# Patient Record
Sex: Female | Born: 1985 | Race: White | Hispanic: Yes | Marital: Single | Smoking: Never smoker
Health system: Southern US, Community
[De-identification: ages and names within clinical notes are randomized; demographics above are authoritative.]

## PROBLEM LIST (undated history)

## (undated) ENCOUNTER — Emergency Department: Admission: EM | Payer: Self-pay | Source: Home / Self Care

## (undated) DIAGNOSIS — D649 Anemia, unspecified: Secondary | ICD-10-CM

## (undated) DIAGNOSIS — K5900 Constipation, unspecified: Secondary | ICD-10-CM

## (undated) HISTORY — DX: Constipation, unspecified: K59.00

## (undated) HISTORY — DX: Anemia, unspecified: D64.9

## (undated) HISTORY — PX: COLON SURGERY: SHX602

---

## 2009-02-22 ENCOUNTER — Emergency Department: Payer: Self-pay | Admitting: Emergency Medicine

## 2009-06-05 ENCOUNTER — Inpatient Hospital Stay (HOSPITAL_COMMUNITY): Admission: AD | Admit: 2009-06-05 | Discharge: 2009-06-05 | Payer: Self-pay | Admitting: Obstetrics & Gynecology

## 2009-09-28 ENCOUNTER — Inpatient Hospital Stay (HOSPITAL_COMMUNITY): Admission: AD | Admit: 2009-09-28 | Discharge: 2009-10-02 | Payer: Self-pay | Admitting: Obstetrics & Gynecology

## 2009-12-30 ENCOUNTER — Emergency Department (HOSPITAL_COMMUNITY): Admission: EM | Admit: 2009-12-30 | Discharge: 2009-12-30 | Payer: Self-pay | Admitting: Emergency Medicine

## 2010-04-18 LAB — URINALYSIS, ROUTINE W REFLEX MICROSCOPIC
Glucose, UA: NEGATIVE mg/dL
Protein, ur: 30 mg/dL — AB
pH: 5.5 (ref 5.0–8.0)

## 2010-04-18 LAB — DIFFERENTIAL
Basophils Relative: 0 % (ref 0–1)
Lymphocytes Relative: 18 % (ref 12–46)
Lymphs Abs: 2 10*3/uL (ref 0.7–4.0)
Monocytes Absolute: 0.9 10*3/uL (ref 0.1–1.0)
Monocytes Relative: 8 % (ref 3–12)
Neutro Abs: 8.5 10*3/uL — ABNORMAL HIGH (ref 1.7–7.7)
Neutrophils Relative %: 74 % (ref 43–77)

## 2010-04-18 LAB — GC/CHLAMYDIA PROBE AMP, GENITAL
Chlamydia, DNA Probe: NEGATIVE
GC Probe Amp, Genital: NEGATIVE

## 2010-04-18 LAB — URINE MICROSCOPIC-ADD ON

## 2010-04-18 LAB — CBC
HCT: 32 % — ABNORMAL LOW (ref 36.0–46.0)
Hemoglobin: 11.2 g/dL — ABNORMAL LOW (ref 12.0–15.0)
RBC: 3.81 MIL/uL — ABNORMAL LOW (ref 3.87–5.11)
WBC: 11.4 10*3/uL — ABNORMAL HIGH (ref 4.0–10.5)

## 2010-04-18 LAB — POCT I-STAT, CHEM 8
BUN: 13 mg/dL (ref 6–23)
Chloride: 106 mEq/L (ref 96–112)
Creatinine, Ser: 0.8 mg/dL (ref 0.4–1.2)
Glucose, Bld: 102 mg/dL — ABNORMAL HIGH (ref 70–99)
HCT: 33 % — ABNORMAL LOW (ref 36.0–46.0)
Potassium: 3.5 mEq/L (ref 3.5–5.1)

## 2010-04-21 LAB — CBC
HCT: 26.7 % — ABNORMAL LOW (ref 36.0–46.0)
HCT: 37.4 % (ref 36.0–46.0)
Hemoglobin: 12.8 g/dL (ref 12.0–15.0)
MCH: 31.8 pg (ref 26.0–34.0)
MCHC: 34.2 g/dL (ref 30.0–36.0)
MCV: 90.4 fL (ref 78.0–100.0)
MCV: 92.3 fL (ref 78.0–100.0)
RBC: 2.89 MIL/uL — ABNORMAL LOW (ref 3.87–5.11)
RDW: 13.9 % (ref 11.5–15.5)
WBC: 14.3 10*3/uL — ABNORMAL HIGH (ref 4.0–10.5)

## 2010-04-25 LAB — URINALYSIS, ROUTINE W REFLEX MICROSCOPIC
Bilirubin Urine: NEGATIVE
Specific Gravity, Urine: 1.02 (ref 1.005–1.030)
pH: 7 (ref 5.0–8.0)

## 2010-04-25 LAB — URINE MICROSCOPIC-ADD ON

## 2010-04-25 LAB — URINE CULTURE

## 2010-04-25 LAB — WET PREP, GENITAL
Clue Cells Wet Prep HPF POC: NONE SEEN
Trich, Wet Prep: NONE SEEN
Yeast Wet Prep HPF POC: NONE SEEN

## 2011-02-26 ENCOUNTER — Ambulatory Visit: Payer: Self-pay | Admitting: Family Medicine

## 2013-02-04 IMAGING — US US RENAL KIDNEY
1 series · 17 of 22 positions shown · non-contrast
Comparison: none

REASON FOR EXAM: recurrent UTI
COMMENTS:

PROCEDURE:     US  - US KIDNEY  - February 26, 2011  [DATE]
RESULT:

[Series 1: us renal kidney · 17 of 22 slices shown]
[im 1/22]
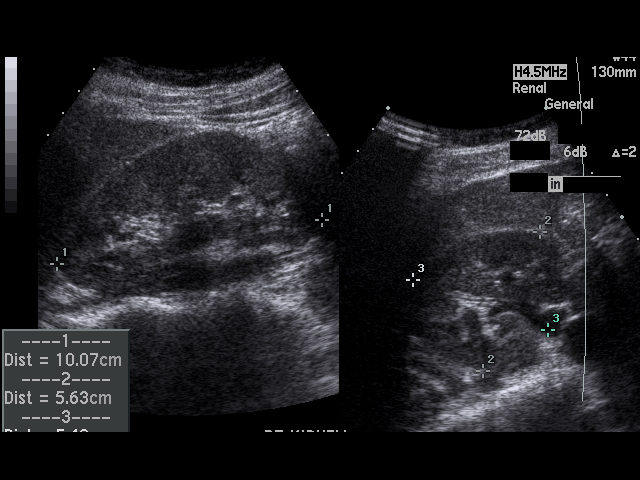
[im 2/22]
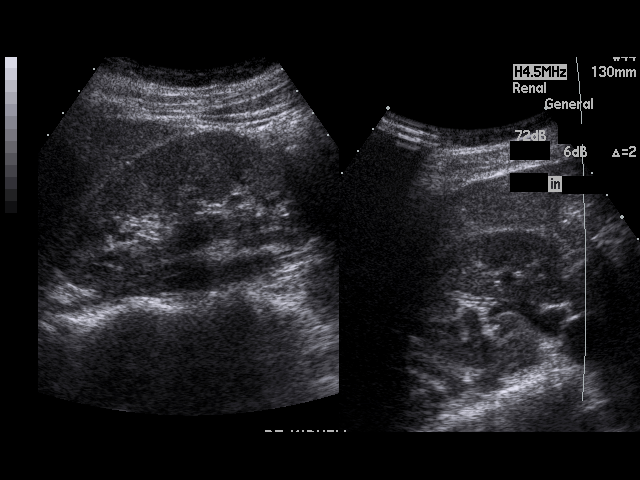
[im 4/22]
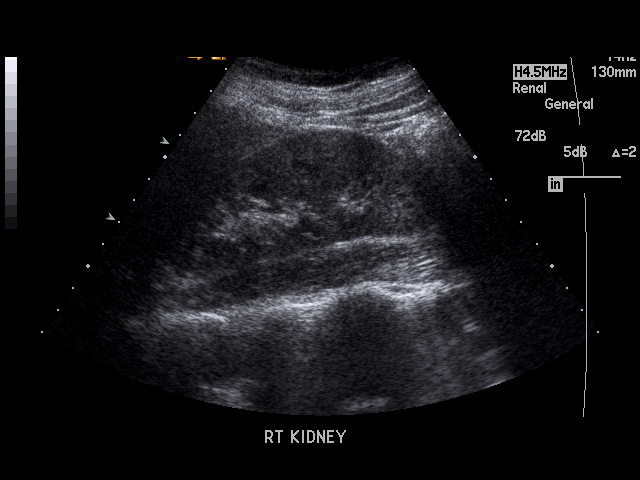
[im 5/22]
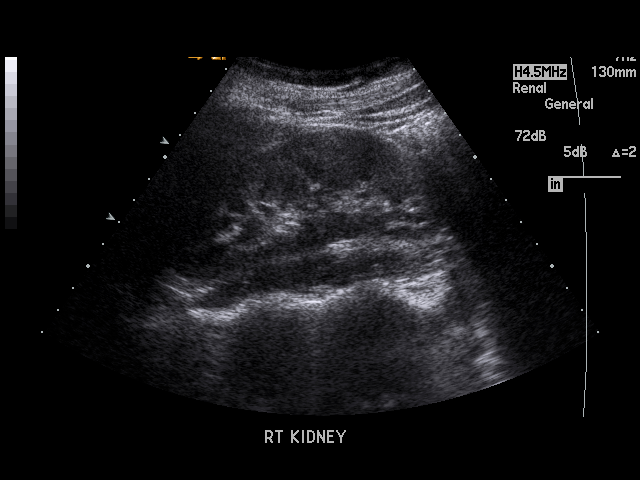
[im 6/22]
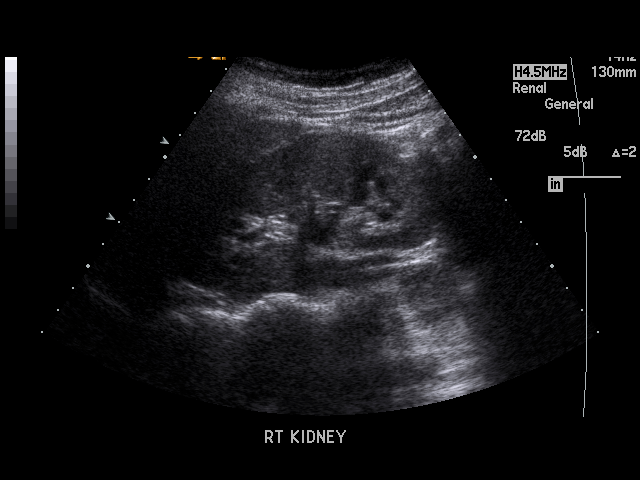
[im 8/22]
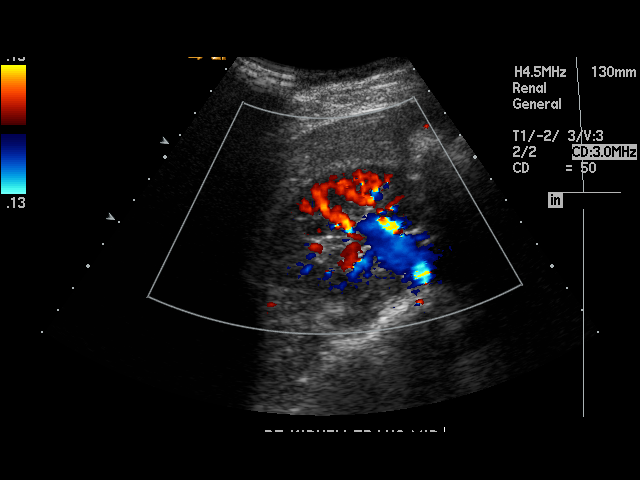
[im 9/22]
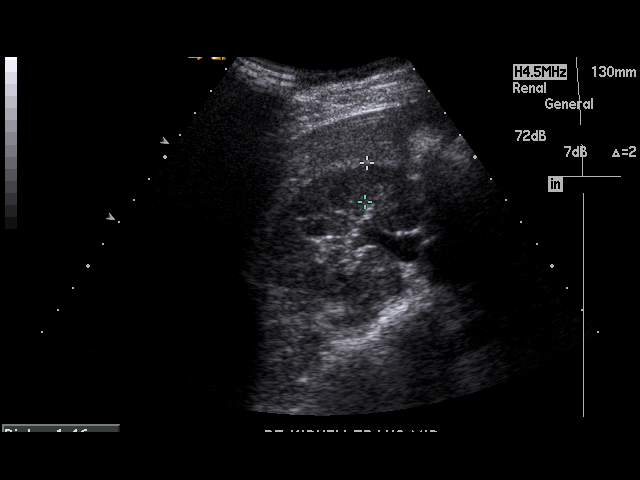
[im 10/22]
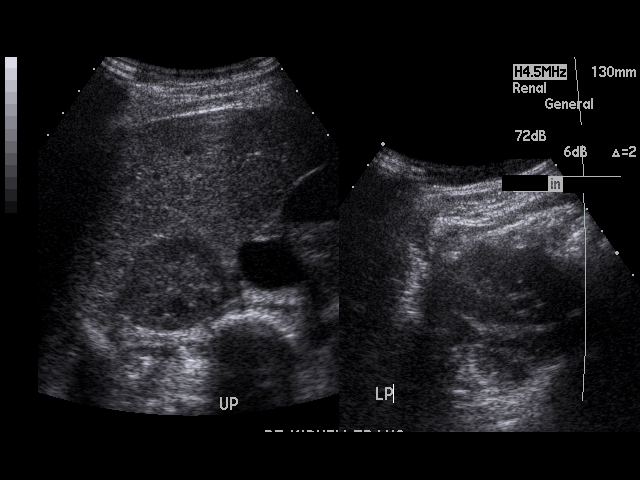
[im 12/22]
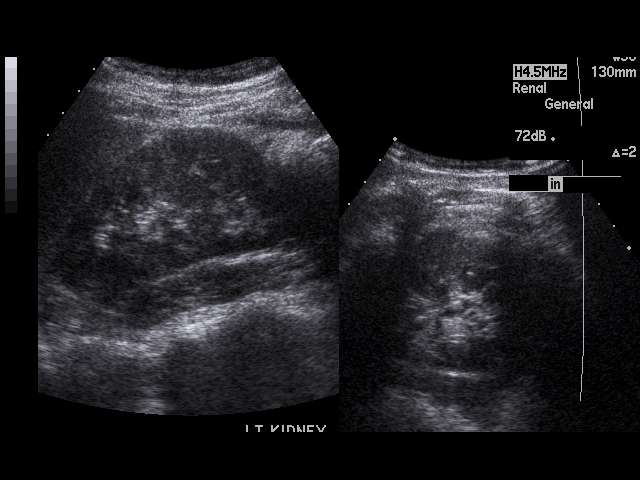
[im 13/22]
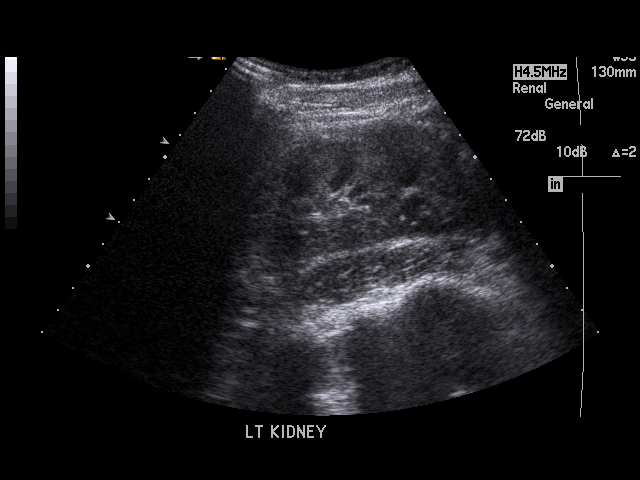
[im 14/22]
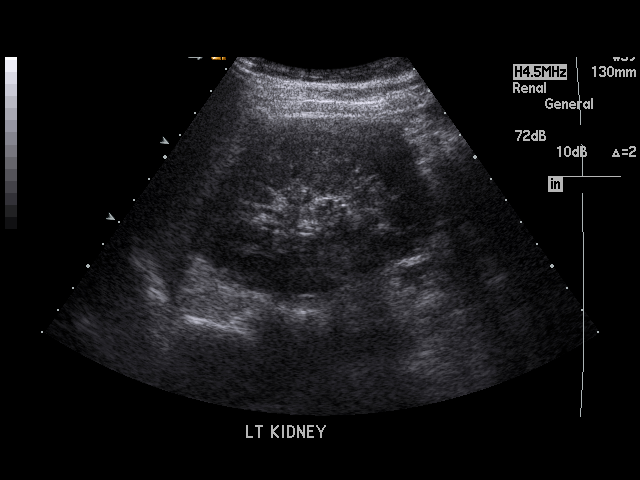
[im 15/22]
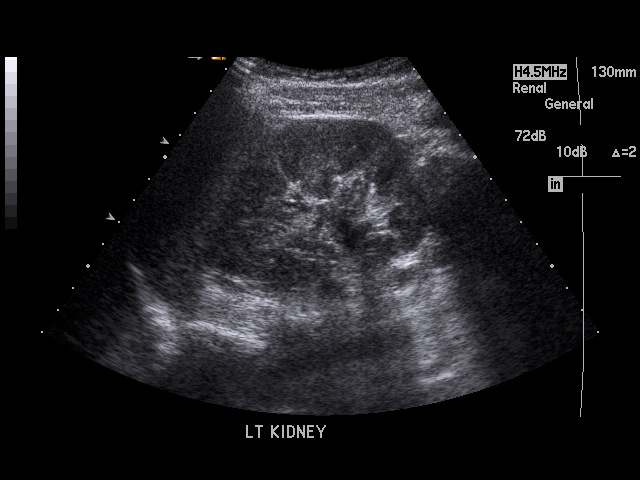
[im 17/22]
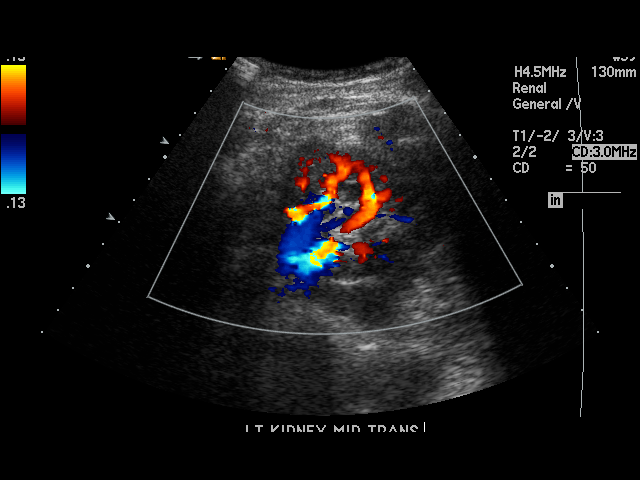
[im 18/22]
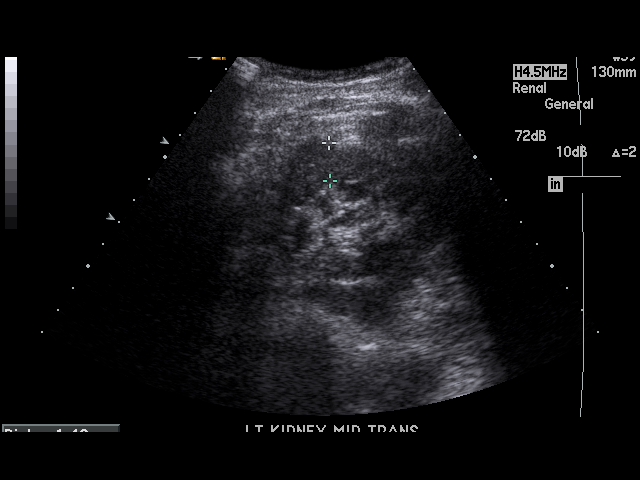
[im 19/22]
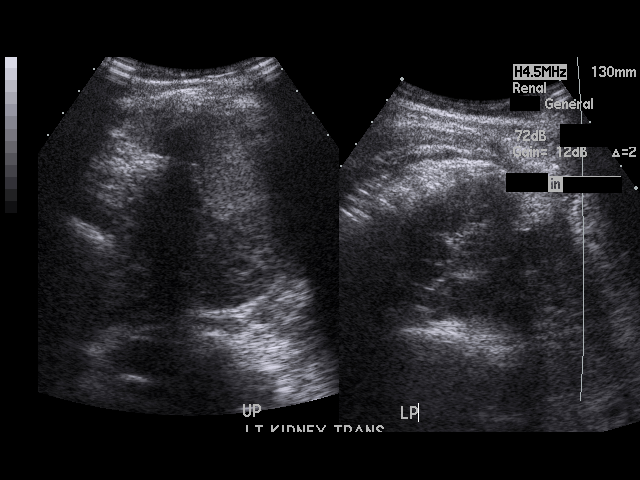
[im 21/22]
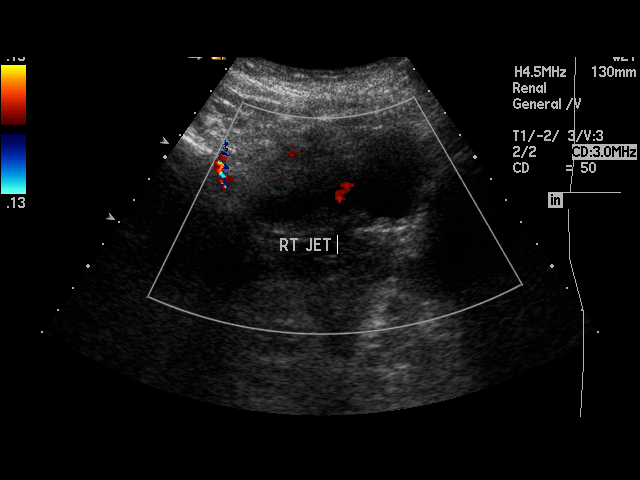
[im 22/22]
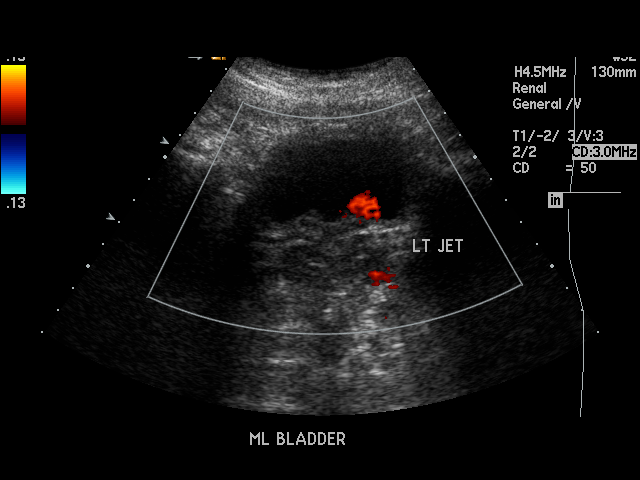

[17 of 22 positions shown; findings below may reference images not displayed]

FINDINGS: The right kidney measures 10.07 x 5.63 x 5.4 cm and on the left
10.28 x 5.79 x 4.58 cm. There is appropriate corticomedullary
differentiation without evidence of hydronephrosis, calculi, or solid or
cystic masses. Bilateral ureteral jets are identified. The urinary bladder
is partially distended. There are findings which raise suspicion of diffuse
bladder wall thickening.
IMPRESSION: Findings which may reflect the sequelae of cystitis, if
clinically appropriate. Otherwise, unremarkable Renal Ultrasound.

## 2013-02-05 LAB — HM PAP SMEAR: HM Pap smear: NEGATIVE

## 2014-09-01 ENCOUNTER — Ambulatory Visit (INDEPENDENT_AMBULATORY_CARE_PROVIDER_SITE_OTHER): Payer: PRIVATE HEALTH INSURANCE | Admitting: Obstetrics and Gynecology

## 2014-09-01 ENCOUNTER — Encounter: Payer: Self-pay | Admitting: Obstetrics and Gynecology

## 2014-09-01 VITALS — BP 100/62 | HR 87 | Ht 63.0 in | Wt 123.8 lb

## 2014-09-01 DIAGNOSIS — Z36 Encounter for antenatal screening of mother: Secondary | ICD-10-CM | POA: Diagnosis not present

## 2014-09-01 DIAGNOSIS — Z3201 Encounter for pregnancy test, result positive: Secondary | ICD-10-CM | POA: Diagnosis not present

## 2014-09-01 DIAGNOSIS — Z369 Encounter for antenatal screening, unspecified: Secondary | ICD-10-CM

## 2014-09-02 LAB — URINALYSIS, ROUTINE W REFLEX MICROSCOPIC
Bilirubin, UA: NEGATIVE
GLUCOSE, UA: NEGATIVE
LEUKOCYTES UA: NEGATIVE
Nitrite, UA: NEGATIVE
Protein, UA: NEGATIVE
RBC UA: NEGATIVE
Specific Gravity, UA: 1.017 (ref 1.005–1.030)
Urobilinogen, Ur: 0.2 mg/dL (ref 0.2–1.0)
pH, UA: 6 (ref 5.0–7.5)

## 2014-09-02 LAB — CBC WITH DIFFERENTIAL/PLATELET
BASOS ABS: 0 10*3/uL (ref 0.0–0.2)
Basos: 0 %
EOS (ABSOLUTE): 0 10*3/uL (ref 0.0–0.4)
EOS: 0 %
Hematocrit: 36.7 % (ref 34.0–46.6)
Hemoglobin: 12.3 g/dL (ref 11.1–15.9)
IMMATURE GRANULOCYTES: 0 %
Immature Grans (Abs): 0 10*3/uL (ref 0.0–0.1)
LYMPHS ABS: 2.3 10*3/uL (ref 0.7–3.1)
Lymphs: 23 %
MCH: 29 pg (ref 26.6–33.0)
MCHC: 33.5 g/dL (ref 31.5–35.7)
MCV: 87 fL (ref 79–97)
MONOCYTES: 5 %
MONOS ABS: 0.5 10*3/uL (ref 0.1–0.9)
Neutrophils Absolute: 7.4 10*3/uL — ABNORMAL HIGH (ref 1.4–7.0)
Neutrophils: 72 %
Platelets: 232 10*3/uL (ref 150–379)
RBC: 4.24 x10E6/uL (ref 3.77–5.28)
RDW: 13.6 % (ref 12.3–15.4)
WBC: 10.2 10*3/uL (ref 3.4–10.8)

## 2014-09-02 LAB — GC/CHLAMYDIA PROBE AMP
Chlamydia trachomatis, NAA: NEGATIVE
Neisseria gonorrhoeae by PCR: NEGATIVE

## 2014-09-02 LAB — ABO AND RH: RH TYPE: POSITIVE

## 2014-09-02 LAB — HEPATITIS B SURFACE ANTIGEN: Hepatitis B Surface Ag: NEGATIVE

## 2014-09-02 LAB — HIV ANTIBODY (ROUTINE TESTING W REFLEX): HIV Screen 4th Generation wRfx: NONREACTIVE

## 2014-09-02 LAB — VARICELLA ZOSTER ANTIBODY, IGG: VARICELLA: 1384 {index} (ref >165)

## 2014-09-02 LAB — RPR: RPR: NONREACTIVE

## 2014-09-02 LAB — RUBELLA ANTIBODY, IGM

## 2014-09-02 NOTE — Progress Notes (Signed)
Subjective:    Paulene Tayag is a 29 y.o. G39P1001 female who presents for evaluation of amenorrhea. She believes she could be pregnant. Pregnancy is desired. Sexual Activity: single partner, contraception: none. Current symptoms also include: morning sickness and positive home pregnancy test. Last period was normal.  Patient's last menstrual period was 06/25/2014.    The following portions of the patient's history were reviewed and updated as appropriate: allergies, current medications, past family history, past medical history, past social history, past surgical history and problem list.  Review of Systems Pertinent items are noted in HPI.     Objective:    BP 100/62 mmHg  Pulse 87  Ht  (1.6 m)  Wt 123 lb 12.8 oz (56.155 kg)  BMI 21.94 kg/m2  LMP 06/25/2014 General: alert, no distress and no acute distress    Lab Review Urine HCG: positive    Assessment:    Absence of menstruation.     Plan:    Pregnancy Test: Positive: EDC: 03/31/14, EGA 9.5 weeks. Briefly discussed pre-natal care options. Encouraged well-balanced diet, plenty of rest when needed, pre-natal vitamins daily and walking for exercise. Discussed self-help for nausea, avoiding OTC medications until consulting provider or pharmacist, other than Tylenol as needed, minimal caffeine (1-2 cups daily) and avoiding alcohol. She will schedule her initial OB visit in the next month with her PCP or OB provider. Feel free to call with any questions. Will order pelvic sono to confirm gestational age.  Also desires genetic testing.  Can perform at the same time.    RTC in 2 weeks for NOB visit.  Patient also reports upcoming trip to [place].  Discussed travel precautions, Zika virus prevention tips given.  Will monitor once patient returns from trip.  Advised on using condoms for 6-8 weeks after patients return for prevention of sexual transmission.    Hildred Laser, MD [facility]

## 2014-09-03 LAB — URINE CULTURE: ORGANISM ID, BACTERIA: NO GROWTH

## 2014-09-14 ENCOUNTER — Ambulatory Visit: Payer: PRIVATE HEALTH INSURANCE

## 2014-09-14 ENCOUNTER — Ambulatory Visit (INDEPENDENT_AMBULATORY_CARE_PROVIDER_SITE_OTHER): Payer: PRIVATE HEALTH INSURANCE | Admitting: Obstetrics and Gynecology

## 2014-09-14 ENCOUNTER — Other Ambulatory Visit: Payer: Self-pay | Admitting: Obstetrics and Gynecology

## 2014-09-14 ENCOUNTER — Encounter: Payer: Self-pay | Admitting: Obstetrics and Gynecology

## 2014-09-14 VITALS — BP 101/57 | HR 80 | Wt 125.1 lb

## 2014-09-14 DIAGNOSIS — O34219 Maternal care for unspecified type scar from previous cesarean delivery: Secondary | ICD-10-CM

## 2014-09-14 DIAGNOSIS — Z3492 Encounter for supervision of normal pregnancy, unspecified, second trimester: Secondary | ICD-10-CM | POA: Diagnosis not present

## 2014-09-14 DIAGNOSIS — N898 Other specified noninflammatory disorders of vagina: Secondary | ICD-10-CM

## 2014-09-14 DIAGNOSIS — Z369 Encounter for antenatal screening, unspecified: Secondary | ICD-10-CM

## 2014-09-14 DIAGNOSIS — Z3201 Encounter for pregnancy test, result positive: Secondary | ICD-10-CM

## 2014-09-14 DIAGNOSIS — O3421 Maternal care for scar from previous cesarean delivery: Secondary | ICD-10-CM

## 2014-09-14 DIAGNOSIS — Z36 Encounter for antenatal screening of mother: Secondary | ICD-10-CM | POA: Diagnosis not present

## 2014-09-14 LAB — POCT URINALYSIS DIPSTICK
Bilirubin, UA: NEGATIVE
Blood, UA: NEGATIVE
GLUCOSE UA: NEGATIVE
KETONES UA: NEGATIVE
Leukocytes, UA: NEGATIVE
NITRITE UA: NEGATIVE
Protein, UA: NEGATIVE
Spec Grav, UA: <=1.005
Urobilinogen, UA: 0.2
pH, UA: 8.5

## 2014-09-14 NOTE — Progress Notes (Signed)
Subjective:  Interpreter used for today's visit.    Laura Woods is being seen today for her first obstetrical visit.  This is not a planned pregnancy. She is at [redacted]w[redacted]d gestation. Estimated Date of Delivery: 03/16/15 (by today's sono, inconsistent with dates).  Her obstetrical history is significant for none. Relationship with FOB: spouse, living together. Patient does intend to breast feed. Pregnancy history fully reviewed.  Menstrual History: Obstetric History   G2   P1   T1   P0   A0   TAB0   SAB0   E0   M0   L1     # Outcome Date GA Lbr Len/2nd Weight Sex Delivery Anes PTL Lv  2 Current           1 Term 09/29/09 [redacted]w[redacted]d  7 lb (3.175 kg) M CS-LTranv   Y      Menarche age: 49  Patient's last menstrual period was 06/25/2014. Denies h/o abnormal pap smears or STIs.  Last pap smear was 2015, normal, per pt (performed by PCP at [facility]).    History reviewed. No pertinent past medical history.  Past Surgical History  Procedure Laterality Date  . Colon surgery     Family History  Problem Relation Age of Onset  . Healthy Mother   . Healthy Father     History   Social History  . Marital Status: Single    Spouse Name: N/A  . Number of Children: N/A  . Years of Education: N/A   Occupational History  . Not on file.   Social History Main Topics  . Smoking status: Never Smoker   . Smokeless tobacco: Never Used  . Alcohol Use: No  . Drug Use: No  . Sexual Activity: Yes    Birth Control/ Protection: Condom     Comment: Pregnant   Other Topics Concern  . Not on file   Social History Narrative    Outpatient Encounter Prescriptions as of 09/14/2014  Medication Sig  . Prenatal Vit-Fe Fumarate-FA (MULTIVITAMIN-PRENATAL) 27-0.8 MG TABS tablet Take 1 tablet by mouth daily at 12 noon.    No Known Allergies  Review of Systems General:Not Present- Fever, Weight Loss and Weight Gain. Skin:Not Present- Rash. HEENT:Not Present- Blurred Vision, Headache and  Bleeding Gums. Respiratory:Not Present- Difficulty Breathing. Breast:Not Present- Breast Mass. Cardiovascular:Not Present- Chest Pain, Elevated Blood Pressure, Fainting / Blacking Out and Shortness of Breath. Gastrointestinal:Not Present- Abdominal Pain, Constipation, Nausea and Vomiting. Female Genitourinary:Present - Vaginal Discharge (white, thin, no odor, mild itching 2 days ago). Not Present- Frequency, Painful Urination, Pelvic Pain, Vaginal Bleeding, Contractions, regular, Fetal Movements Decreased, Urinary Complaints and Vaginal Fluid. Musculoskeletal:Not Present- Back Pain and Leg Cramps. Neurological:Not Present- Dizziness. Psychiatric:Not Present- Depression.    Objective:   Blood pressure 101/57, pulse 80, weight 125 lb 1.6 oz (56.745 kg), last menstrual period 06/25/2014.   General Appearance:    Alert, cooperative, no distress, appears stated age  Head:    Normocephalic, without obvious abnormality, atraumatic  Eyes:    PERRL, conjunctiva/corneas clear, EOM's intact, both eyes  Ears:    Normal external ear canals, both ears  Nose:   Nares normal, septum midline, mucosa normal, no drainage or sinus tenderness  Throat:   Lips, mucosa, and tongue normal; teeth and gums normal  Neck:   Supple, symmetrical, trachea midline, no adenopathy; thyroid: no enlargement/tenderness/nodules; no       carotid bruit or JVD  Back:     Symmetric, no curvature, ROM normal,  no CVA tenderness  Lungs:     Clear to auscultation bilaterally, respirations unlabored  Chest Wall:    No tenderness or deformity   Heart:    Regular rate and rhythm, S1 and S2 normal, no murmur, rub or gallop  Breast Exam:    No tenderness, masses, or nipple abnormality  Abdomen:     Soft, non-tender, bowel sounds active all four quadrants, no masses, no organomegaly.  FH 14.  FHT 152 bpm. Well healed Pfannenstiel incision.   Genitalia:    Pelvic:external genitalia normal, vagina without lesions, or tenderness,  rectovaginal septum normal.  Vagina with small amount of thin white discharge, no odor.  Cervix normal in appearance, no cervical motion tenderness, no adnexal masses or tenderness. Pregnancy positive findings: uterine enlargement: 14 wk size, nontender.   Rectal:    Normal external sphincter.  No hemorrhoids appreciated. Internal exam not done.   Extremities:   Extremities normal, atraumatic, no cyanosis or edema  Pulses:   2+ and symmetric all extremities  Skin:   Skin color, texture, turgor normal, no rashes or lesions  Lymph nodes:   Cervical, supraclavicular, and axillary nodes normal  Neurologic:   CNII-XII intact, normal strength, sensation and reflexes throughout      Ultrasound OB 09/14/2014:  Findings:  Singleton intrauterine pregnancy is visualized with a CRL consistent with 13 6/[redacted] weeks gestation, giving an (U/S) EDD of 03-16-2015. The (U/S) EDD isNOT consistent with the clinically established (LMP) EDD of 04-01-15.  FHR: 162 bpm CRL measurement: 78.9 mm NT measurement: 3.1 mm. (Just above normal = 3 mm) Early anatomy appears normal.  Survey of the adnexa demonstrates no adnexal masses.  There is no free peritoneal fluid in the cul de sac.  Assessment:    Pregnancy at 13 and 6/7 weeks (by today's sono, inconsistent with dates)  H/o prior C-section x 1 NT measurement thickened on ultrasound H/o vaginal discharge  Plan:   1. Pregnancy at [redacted]w[redacted]d - Initial labs reviewed, normal. - Prenatal vitamins. - Problem list reviewed and updated. - AFP3 discussed: performed today. NT measurement on ultrasound is borderline elevated.  NT on ultrasound borderline elevated (3.1 mm, cut off 3.0 mm).  Discussed findings with patient, including opting for a different test [employer] screen, Panorama) or continuing with 1st trimester screen as she is at the cutoff gestational age.  Patient desires to continue with current testing.  - New OB counseling:  The patient has been given an overview  regarding routine prenatal care.  Recommendations regarding diet, weight gain, and exercise in pregnancy were given. Prenatal testing, optional genetic testing, and ultrasound use in pregnancy were reviewed.  - Benefits of Breast Feeding were discussed. The patient is encouraged to consider nursing her baby post partum. 2.  H/o prior C-section x 1. - H/o prior C-section x 1 - performed in [place] (failure to progress?).  Brief discussion had on TOLAC vs repeat C-section.  Patient undecided currently, but leaning more toward TOLAC. To continue discussion at next visit.  3. Vaginal discharge - no odor or vaginal irritation noted. Nuswab sent.  Follow up in 4 weeks.   Hildred Laser, MD [facility]

## 2014-09-14 NOTE — Progress Notes (Signed)
NOB PE today. Pt c/o White d/c - itchy at times. ? Yeast infection. nt scan today.  Last pap- 10/2013- wnl.

## 2014-09-16 LAB — FIRST TRIMESTER SCREEN W/NT
CRL: 78.9 mm
DIA MOM: 0.89
DIA VALUE: 216.1 pg/mL
Gest Age-Collect: 13.3 weeks
MATERNAL AGE AT EDD: 29.6 a
NUCHAL TRANSLUCENCY MOM: 1.68
Nuchal Translucency: 3.1 mm
Number of Fetuses: 1
PAPP-A MOM: 0.67
PAPP-A Value: 1104.5 ng/mL
Test Results:: NEGATIVE
Weight: 125 [lb_av]
hCG MoM: 0.91
hCG Value: 82.8 IU/mL

## 2014-09-18 LAB — NUSWAB BV AND CANDIDA, NAA: Atopobium vaginae: HIGH Score — AB

## 2014-10-12 ENCOUNTER — Encounter: Payer: Self-pay | Admitting: Obstetrics and Gynecology

## 2014-10-12 ENCOUNTER — Ambulatory Visit (INDEPENDENT_AMBULATORY_CARE_PROVIDER_SITE_OTHER): Payer: PRIVATE HEALTH INSURANCE | Admitting: Obstetrics and Gynecology

## 2014-10-12 VITALS — BP 96/55 | HR 97 | Wt 132.4 lb

## 2014-10-12 DIAGNOSIS — Z3402 Encounter for supervision of normal first pregnancy, second trimester: Secondary | ICD-10-CM

## 2014-10-12 LAB — POCT URINALYSIS DIPSTICK
BILIRUBIN UA: NEGATIVE
Glucose, UA: NEGATIVE
Ketones, UA: NEGATIVE
LEUKOCYTES UA: NEGATIVE
Nitrite, UA: NEGATIVE
PH UA: 6.5
PROTEIN UA: NEGATIVE
RBC UA: NEGATIVE
Spec Grav, UA: 1.015
UROBILINOGEN UA: NEGATIVE

## 2014-10-12 MED ORDER — PRENATE DHA 18-0.6-0.4-300 MG PO CAPS: ORAL_CAPSULE | ORAL | Status: DC

## 2014-10-12 NOTE — Progress Notes (Signed)
ROB: Patient denies complaints. 1st trimester screen neg.  Further discussion had regarding TOLAC vs VBAC (h/o prior C-section x 43for fetal malpositioning, arrest of 2nd stage of labor at 39 weeks).  Patient still unsure, given handout.  To discuss next visit.  For serum AFP today.  RTC in 4 weeks for next visit and anatomy scan.  May be planning to travel to [place] after next visit.  Zika Virus transmission and prevention strategies discussed, handout given.

## 2014-10-18 LAB — AFP, SERUM, OPEN SPINA BIFIDA
AFP MoM: 0.8
AFP Value: 35.8 ng/mL
GEST. AGE ON COLLECTION DATE: 17.9 wk
Maternal Age At EDD: 29.6 years
OSBR Risk 1 IN: 10000
Test Results:: NEGATIVE
Weight: 132 [lb_av]

## 2014-11-10 ENCOUNTER — Ambulatory Visit: Payer: PRIVATE HEALTH INSURANCE

## 2014-11-10 ENCOUNTER — Ambulatory Visit (INDEPENDENT_AMBULATORY_CARE_PROVIDER_SITE_OTHER): Payer: PRIVATE HEALTH INSURANCE | Admitting: Obstetrics and Gynecology

## 2014-11-10 VITALS — BP 111/58 | HR 85 | Temp 98.6°F | Wt 137.0 lb

## 2014-11-10 DIAGNOSIS — Z3402 Encounter for supervision of normal first pregnancy, second trimester: Secondary | ICD-10-CM | POA: Diagnosis not present

## 2014-11-10 DIAGNOSIS — O34219 Maternal care for unspecified type scar from previous cesarean delivery: Secondary | ICD-10-CM | POA: Insufficient documentation

## 2014-11-10 DIAGNOSIS — Z3492 Encounter for supervision of normal pregnancy, unspecified, second trimester: Secondary | ICD-10-CM | POA: Diagnosis not present

## 2014-11-10 DIAGNOSIS — Z23 Encounter for immunization: Secondary | ICD-10-CM | POA: Diagnosis not present

## 2014-11-10 LAB — POCT URINALYSIS DIP (MANUAL ENTRY)
BILIRUBIN UA: NEGATIVE
Bilirubin, UA: NEGATIVE
Blood, UA: NEGATIVE
Glucose, UA: NEGATIVE
Leukocytes, UA: NEGATIVE
Nitrite, UA: NEGATIVE
PH UA: 7
PROTEIN UA: NEGATIVE
Urobilinogen, UA: 0.2

## 2014-11-10 NOTE — Progress Notes (Signed)
ROB: Patient denies complaints.  S/p normal anatomy scan today.  Discussed traveling precautions, patient unsure when she may be leaving for [place]. Also disscused TOLAC vs. Repeat c-section again.  Still unsure.  Also discussed BTL, including risks, benefits.  Uncertain at this time.  To continue discusions at subsequent visits. Handouts given.  Flu vaccine given today. RTC in 4 weeks.

## 2014-12-09 ENCOUNTER — Encounter: Payer: PRIVATE HEALTH INSURANCE | Admitting: Obstetrics and Gynecology

## 2014-12-14 ENCOUNTER — Ambulatory Visit (INDEPENDENT_AMBULATORY_CARE_PROVIDER_SITE_OTHER): Payer: PRIVATE HEALTH INSURANCE | Admitting: Obstetrics and Gynecology

## 2014-12-14 ENCOUNTER — Encounter: Payer: Self-pay | Admitting: Obstetrics and Gynecology

## 2014-12-14 VITALS — BP 109/69 | HR 96 | Wt 145.7 lb

## 2014-12-14 DIAGNOSIS — Z3492 Encounter for supervision of normal pregnancy, unspecified, second trimester: Secondary | ICD-10-CM

## 2014-12-14 LAB — POCT URINALYSIS DIPSTICK
Bilirubin, UA: NEGATIVE
GLUCOSE UA: 500
Ketones, UA: NEGATIVE
Nitrite, UA: NEGATIVE
Protein, UA: NEGATIVE
SPEC GRAV UA: 1.025
UROBILINOGEN UA: NEGATIVE
pH, UA: 6

## 2014-12-14 NOTE — Patient Instructions (Signed)
Ligadura de trompas posparto  (Postpartum Tubal Ligation)  La ligadura de trompas posparto es un procedimiento que se realiza para cerrar las trompas de Falopio inmediatamente después del parto, o uno o dos días después. La ligadura de trompas posparto se realiza antes de que el útero recupere su posición normal. El procedimiento también se denomina minilaparotomía. Cuando las trompas de Falopio se cierran, los óvulos que liberan los ovarios no pueden ingresar al útero, y los espermatozoides no pueden llegar a los óvulos. La ligadura de trompas posparto se realiza para evitar los embarazos.  Si bien este procedimiento puede revertirse, debe considerarse permanente e irreversible. Si usted desea quedar embarazada en el futuro, no debe realizarse este procedimiento.  INFORME A SU MÉDICO:  · Cualquier alergia que tenga.  · Todos los medicamentos que utiliza, incluidos vitaminas, hierbas, gotas oftálmicas, cremas y medicamentos de venta libre. Esto incluye cualquier clase de corticoide, ya sea por vía oral o en crema.  · Problemas previos que usted o los miembros de su familia hayan tenido con el uso de anestésicos.  · Enfermedades de la sangre que tenga.  · Si tiene cirugías previas.  · Si tiene alguna enfermedad.  RIESGOS Y COMPLICACIONES  · Infección.  · Hemorragia.  · Lesión en los órganos circundantes.  · Efectos secundarios de los anestésicos.  · Fallas en el procedimiento.  · Embarazo ectópico.  · Arrepentimiento posterior por haberse realizado el procedimiento.  ANTES DEL PROCEDIMIENTO  · Quizás deba firmar ciertos documentos, incluido un formulario de consentimiento informado, hasta 30 días antes de la fecha de la ligadura de trompas.  · Siga las indicaciones del médico respecto de las restricciones para las comidas y las bebidas.  PROCEDIMIENTO   Si se realiza 1 o 2 días después del parto vaginal:  · Podrán administrarle uno o más de los siguientes medicamentos:    Un medicamento para ayudarla a relajarse  (sedante).    Un medicamento que adormece el área (anestesia local).    Un medicamento que la hará dormir (anestesia general).    Un medicamento que se inyecta en una zona del cuerpo para adormecer toda la región que se encuentra por debajo del lugar de la inyección (anestesia regional).  · Si le administran anestesia general, le introducirán un tubo en la garganta para ayudarla a respirar.  · Es posible que le vacíen la vejiga con una pequeña sonda (catéter).  · Le harán un pequeño corte (incisión) justo por encima de la línea del vello púbico.  · A través de la incisión, las trompas se ubicarán y se subirán.  · Luego, se atarán o quemarán (cauterizarán), o se cerrarán con un clip, un anillo o una abrazadera. En muchos casos, también se eliminará una pequeña parte en el centro de cada trompa de Falopio.  · La incisión se cerrará con puntos (suturas).  · Se colocará un vendaje (apósito) sobre la incisión.  Este procedimiento puede variar según el médico y el hospital.  Si se realiza después de una cesárea:  · La ligadura de trompas se hará a través de la misma incisión del parto por cesárea.  · Después de obstruir las trompas, la incisión se cerrará con puntos (suturas).  · Se colocará un vendaje (apósito) sobre la incisión.  Este procedimiento puede variar según el médico y el hospital.  DESPUÉS DEL PROCEDIMIENTO  · Le controlarán con frecuencia la presión arterial, la frecuencia cardíaca, la frecuencia respiratoria y el nivel de oxígeno en la sangre hasta que haya desaparecido   el efecto de los medicamentos administrados.  · Le darán medicamentos para el dolor si los necesita.  · Si le administraron anestesia general, puede tener algunas molestias leves en la garganta. Esto se debe al tubo que le colocaron en la garganta para poder respirar mientras dormía.  · Puede sentirse cansada, y es conveniente que haga reposo durante el resto del día.  · Puede sentir algo de dolor o calambres en el área abdominal durante 3 a  7 días.     Esta información no tiene como fin reemplazar el consejo del médico. Asegúrese de hacerle al médico cualquier pregunta que tenga.     Document Released: 11/01/2004 Document Revised: 06/08/2014  Elsevier Interactive Patient Education ©2016 Elsevier Inc.

## 2014-12-14 NOTE — Progress Notes (Signed)
ROB: Notes cramping in legs, mostly at night.  Also notes mild cramping in abdomen. Notes adequate hydration. Advised on increasing potassium/calcium (Tums, bananas, milk).  [facility]  Desires B[facility] postpartum.  Will have patient sign papers next visit.  For Tdap and blood consent next visit. Glucosuria noted in urine.  Will have patient complete 1 hr glucola this week.

## 2014-12-15 ENCOUNTER — Telehealth: Payer: Self-pay | Admitting: Obstetrics and Gynecology

## 2014-12-15 NOTE — Telephone Encounter (Signed)
Pt 27 wks and is spotting brownish fluid., she wants to know if this is normal

## 2014-12-17 ENCOUNTER — Other Ambulatory Visit: Payer: Self-pay | Admitting: Obstetrics and Gynecology

## 2014-12-17 ENCOUNTER — Other Ambulatory Visit: Payer: PRIVATE HEALTH INSURANCE

## 2014-12-17 ENCOUNTER — Other Ambulatory Visit: Payer: Self-pay

## 2014-12-17 DIAGNOSIS — Z3490 Encounter for supervision of normal pregnancy, unspecified, unspecified trimester: Secondary | ICD-10-CM

## 2014-12-17 DIAGNOSIS — Z131 Encounter for screening for diabetes mellitus: Secondary | ICD-10-CM

## 2014-12-17 NOTE — Telephone Encounter (Signed)
Pt states that yesterday she had some brownish colored discharge, pt states that she had to wear a panty liner. Pt denies any bright red bleeding or cramping, or leakage of fluid. Advised pt to continue to monitor but there was no need for alarm at this time.

## 2014-12-18 LAB — HEMOGLOBIN AND HEMATOCRIT, BLOOD
Hematocrit: 31.7 % — ABNORMAL LOW (ref 34.0–46.6)
Hemoglobin: 10.6 g/dL — ABNORMAL LOW (ref 11.1–15.9)

## 2014-12-18 LAB — GLUCOSE, 1 HOUR GESTATIONAL: Gestational Diabetes Screen: 118 mg/dL (ref 65–139)

## 2014-12-28 ENCOUNTER — Ambulatory Visit (INDEPENDENT_AMBULATORY_CARE_PROVIDER_SITE_OTHER): Payer: PRIVATE HEALTH INSURANCE | Admitting: Obstetrics and Gynecology

## 2014-12-28 VITALS — BP 105/69 | HR 102 | Wt 146.0 lb

## 2014-12-28 DIAGNOSIS — Z23 Encounter for immunization: Secondary | ICD-10-CM

## 2014-12-28 DIAGNOSIS — Z3493 Encounter for supervision of normal pregnancy, unspecified, third trimester: Secondary | ICD-10-CM | POA: Diagnosis not present

## 2014-12-28 LAB — POCT URINALYSIS DIPSTICK
BILIRUBIN UA: NEGATIVE
Blood, UA: NEGATIVE
GLUCOSE UA: NEGATIVE
KETONES UA: NEGATIVE
LEUKOCYTES UA: NEGATIVE
Nitrite, UA: NEGATIVE
Protein, UA: NEGATIVE
SPEC GRAV UA: 1.02
Urobilinogen, UA: NEGATIVE
pH, UA: 6.5

## 2014-12-28 NOTE — Progress Notes (Signed)
ROB: Doing well, notes very active fetal movement. For Tdap today, signed blood consent.  Now debating between BTL and IUD.  Given information on IUDs (received info on BTL last visit). 1 hr glucola normal. RTC in 2 weeks.

## 2014-12-28 NOTE — Patient Instructions (Signed)
Informacin sobre el dispositivo intrauterino (Intrauterine Device Information) Un dispositivo intrauterino (DIU) se inserta en el tero e impide el embarazo. Hay dos tipos de DIU:   DIU de cobre: este tipo de DIU est recubierto con un alambre de cobre y se inserta dentro del tero. El cobre hace que el tero y las trompas de Falopio produzcan un liquido que destruye los espermatozoides. El DIU de cobre puede permanecer en el lugar durante 10 aos.  DIU con hormona: este tipo de DIU contiene la hormona progestina (progesterona sinttica). Las hormonas hacen que el moco cervical se haga ms espeso, lo que evita que el esperma ingrese al tero. Tambin hace que la membrana que recubre internamente al tero sea ms delgada lo que impide el implante del vulo fertilizado. La hormona debilita o destruye los espermatozoides que ingresan al tero. Alguno de los tipos de DIU hormonal pueden permanecer en el lugar durante 5 aos y otros tipos pueden dejarse en el lugar por 3 aos. El mdico se asegurar de que usted sea una buena candidata para usar el DIU. Converse con su mdico acerca de los posibles efectos secundarios.  VENTAJAS DEL DISPOSITIVO INTRAUTERINO  El DIU es muy eficaz, reversible, de accin prolongada y de bajo mantenimiento.  No hay efectos secundarios relacionados con el estrgeno.  El DIU puede ser utilizado durante la lactancia.  No est asociado con el aumento de peso.  Funciona inmediatamente despus de la insercin.  El DIU hormonal funciona inmediatamente si se inserta dentro de los 7 das del inicio del perodo. Ser necesario que utilice un mtodo anticonceptivo adicional durante 7 das si el DIU hormonal se inserta en algn otro momento del ciclo.  El DIU de cobre no interfiere con las hormonas femeninas.  El DIU hormonal puede hacer que los perodos menstruales abundantes se hagan ms ligeros y que haya menos clicos.  El DIU hormonal puede usarse durante 3 a 5  aos.  El DIU de cobre puede usarse durante 10 aos. DESVENTAJAS DEL DISPOSITIVO INTRAUTERINO  El DIU hormonal puede estar asociado con patrones de sangrado irregular.  El DIU de cobre puede hacer que el flujo menstrual ms abundante y doloroso.  Puede experimentar clicos y sangrado vaginal despus de la insercin.   Esta informacin no tiene como fin reemplazar el consejo del mdico. Asegrese de hacerle al mdico cualquier pregunta que tenga.   Document Released: 07/12/2009 Document Revised: 09/24/2012 Elsevier Interactive Patient Education 2016 Elsevier Inc.  

## 2015-01-13 ENCOUNTER — Ambulatory Visit (INDEPENDENT_AMBULATORY_CARE_PROVIDER_SITE_OTHER): Payer: PRIVATE HEALTH INSURANCE | Admitting: Obstetrics and Gynecology

## 2015-01-13 VITALS — BP 118/64 | HR 97 | Wt 147.4 lb

## 2015-01-13 DIAGNOSIS — O34219 Maternal care for unspecified type scar from previous cesarean delivery: Secondary | ICD-10-CM

## 2015-01-13 DIAGNOSIS — Z3483 Encounter for supervision of other normal pregnancy, third trimester: Secondary | ICD-10-CM

## 2015-01-13 DIAGNOSIS — Z3493 Encounter for supervision of normal pregnancy, unspecified, third trimester: Secondary | ICD-10-CM

## 2015-01-13 LAB — POCT URINALYSIS DIPSTICK
Bilirubin, UA: NEGATIVE
Glucose, UA: NEGATIVE
Ketones, UA: NEGATIVE
Leukocytes, UA: NEGATIVE
NITRITE UA: NEGATIVE
PH UA: 6.5
PROTEIN UA: NEGATIVE
RBC UA: NEGATIVE
SPEC GRAV UA: 1.015
UROBILINOGEN UA: NEGATIVE

## 2015-01-13 NOTE — Progress Notes (Signed)
ROB: Notes complaints of increased vaginal discharge with mucus, no odor, irritation, or itching.  Also notes pain in left upper abodmen, burning sensation in lower  Abdomen.  Patient offered reassurance.  Discussed BTL procedure in detail.  Patient desires, consent signed. RTC in 2 week

## 2015-01-13 NOTE — Patient Instructions (Signed)
Ligadura de trompas posparto  (Postpartum Tubal Ligation)  La ligadura de trompas posparto es un procedimiento que se realiza para cerrar las trompas de Falopio inmediatamente después del parto, o uno o dos días después. La ligadura de trompas posparto se realiza antes de que el útero recupere su posición normal. El procedimiento también se denomina minilaparotomía. Cuando las trompas de Falopio se cierran, los óvulos que liberan los ovarios no pueden ingresar al útero, y los espermatozoides no pueden llegar a los óvulos. La ligadura de trompas posparto se realiza para evitar los embarazos.  Si bien este procedimiento puede revertirse, debe considerarse permanente e irreversible. Si usted desea quedar embarazada en el futuro, no debe realizarse este procedimiento.  INFORME A SU MÉDICO:  · Cualquier alergia que tenga.  · Todos los medicamentos que utiliza, incluidos vitaminas, hierbas, gotas oftálmicas, cremas y medicamentos de venta libre. Esto incluye cualquier clase de corticoide, ya sea por vía oral o en crema.  · Problemas previos que usted o los miembros de su familia hayan tenido con el uso de anestésicos.  · Enfermedades de la sangre que tenga.  · Si tiene cirugías previas.  · Si tiene alguna enfermedad.  RIESGOS Y COMPLICACIONES  · Infección.  · Hemorragia.  · Lesión en los órganos circundantes.  · Efectos secundarios de los anestésicos.  · Fallas en el procedimiento.  · Embarazo ectópico.  · Arrepentimiento posterior por haberse realizado el procedimiento.  ANTES DEL PROCEDIMIENTO  · Quizás deba firmar ciertos documentos, incluido un formulario de consentimiento informado, hasta 30 días antes de la fecha de la ligadura de trompas.  · Siga las indicaciones del médico respecto de las restricciones para las comidas y las bebidas.  PROCEDIMIENTO   Si se realiza 1 o 2 días después del parto vaginal:  · Podrán administrarle uno o más de los siguientes medicamentos:    Un medicamento para ayudarla a relajarse  (sedante).    Un medicamento que adormece el área (anestesia local).    Un medicamento que la hará dormir (anestesia general).    Un medicamento que se inyecta en una zona del cuerpo para adormecer toda la región que se encuentra por debajo del lugar de la inyección (anestesia regional).  · Si le administran anestesia general, le introducirán un tubo en la garganta para ayudarla a respirar.  · Es posible que le vacíen la vejiga con una pequeña sonda (catéter).  · Le harán un pequeño corte (incisión) justo por encima de la línea del vello púbico.  · A través de la incisión, las trompas se ubicarán y se subirán.  · Luego, se atarán o quemarán (cauterizarán), o se cerrarán con un clip, un anillo o una abrazadera. En muchos casos, también se eliminará una pequeña parte en el centro de cada trompa de Falopio.  · La incisión se cerrará con puntos (suturas).  · Se colocará un vendaje (apósito) sobre la incisión.  Este procedimiento puede variar según el médico y el hospital.  Si se realiza después de una cesárea:  · La ligadura de trompas se hará a través de la misma incisión del parto por cesárea.  · Después de obstruir las trompas, la incisión se cerrará con puntos (suturas).  · Se colocará un vendaje (apósito) sobre la incisión.  Este procedimiento puede variar según el médico y el hospital.  DESPUÉS DEL PROCEDIMIENTO  · Le controlarán con frecuencia la presión arterial, la frecuencia cardíaca, la frecuencia respiratoria y el nivel de oxígeno en la sangre hasta que haya desaparecido   el efecto de los medicamentos administrados.  · Le darán medicamentos para el dolor si los necesita.  · Si le administraron anestesia general, puede tener algunas molestias leves en la garganta. Esto se debe al tubo que le colocaron en la garganta para poder respirar mientras dormía.  · Puede sentirse cansada, y es conveniente que haga reposo durante el resto del día.  · Puede sentir algo de dolor o calambres en el área abdominal durante 3 a  7 días.     Esta información no tiene como fin reemplazar el consejo del médico. Asegúrese de hacerle al médico cualquier pregunta que tenga.     Document Released: 11/01/2004 Document Revised: 06/08/2014  Elsevier Interactive Patient Education ©2016 Elsevier Inc.

## 2015-01-27 ENCOUNTER — Ambulatory Visit (INDEPENDENT_AMBULATORY_CARE_PROVIDER_SITE_OTHER): Payer: PRIVATE HEALTH INSURANCE | Admitting: Obstetrics and Gynecology

## 2015-01-27 ENCOUNTER — Encounter: Payer: Self-pay | Admitting: Obstetrics and Gynecology

## 2015-01-27 VITALS — BP 101/66 | HR 91 | Wt 150.5 lb

## 2015-01-27 DIAGNOSIS — Z3493 Encounter for supervision of normal pregnancy, unspecified, third trimester: Secondary | ICD-10-CM

## 2015-01-27 LAB — POCT URINALYSIS DIPSTICK
BILIRUBIN UA: NEGATIVE
Glucose, UA: NEGATIVE
KETONES UA: NEGATIVE
Leukocytes, UA: NEGATIVE
Nitrite, UA: NEGATIVE
PH UA: 6.5
PROTEIN UA: NEGATIVE
RBC UA: NEGATIVE
SPEC GRAV UA: 1.01
Urobilinogen, UA: 0.2

## 2015-01-27 NOTE — Progress Notes (Signed)
ROB-c/o slight pressure

## 2015-01-27 NOTE — Progress Notes (Signed)
ROB- doing well, discussed [facility].  

## 2015-01-27 NOTE — Patient Instructions (Signed)
Contracciones de Braxton Hicks °(Braxton Hicks Contractions) °Durante el embarazo, pueden presentarse contracciones uterinas que no siempre indican que está en trabajo de parto.  °¿QUÉ SON LAS CONTRACCIONES DE BRAXTON HICKS?  °Las contracciones que se presentan antes del trabajo de parto se conocen como contracciones de Braxton Hicks o falso trabajo de parto. Hacia el final del embarazo (32 a 34 semanas), estas contracciones pueden aparecen con más frecuencia y volverse más intensas. No corresponden al trabajo de parto verdadero porque estas contracciones no producen el agrandamiento (la dilatación) y el afinamiento del cuello del útero. Algunas veces, es difícil distinguirlas del trabajo de parto verdadero porque en algunos casos pueden ser muy intensas, y las personas tienen diferentes niveles de tolerancia al dolor. No debe sentirse avergonzada si concurre al hospital con falso trabajo de parto. En ocasiones, la única forma de saber si el trabajo de parto es verdadero es que el médico determine si hay cambios en el cuello del útero. °Si no hay problemas prenatales u otras complicaciones de salud asociadas con el embarazo, no habrá inconvenientes si la envían a su casa con falso trabajo de parto y espera que comience el verdadero. °CÓMO DIFERENCIAR EL TRABAJO DE PARTO FALSO DEL VERDADERO °Falso trabajo de parto °· Las contracciones del falso trabajo de parto duran menos y no son tan intensas como las verdaderas. °· Generalmente son irregulares. °· A menudo, se sienten en la parte delantera de la parte baja del abdomen y en la ingle, °· y pueden desaparecer cuando camina o cambia de posición mientras está acostada. °· Las contracciones se vuelven más débiles y su duración es menor a medida que el tiempo transcurre. °· Por lo general, no se hacen progresivamente más intensas, regulares y cercanas entre sí como en el caso del trabajo de parto verdadero. °Verdadero trabajo de parto °· Las contracciones del verdadero  trabajo de parto duran de 30 a 70 segundos, son muy regulares y suelen volverse más intensas, y aumenta su frecuencia. °· No desaparecen cuando camina. °· La molestia generalmente se siente en la parte superior del útero y se extiende hacia la zona inferior del abdomen y hacia la cintura. °· El médico podrá examinarla para determinar si el trabajo de parto es verdadero. El examen mostrará si el cuello del útero se está dilatando y afinando. °LO QUE DEBE RECORDAR °· Continúe haciendo los ejercicios habituales y siga otras indicaciones que el médico le dé. °· Tome todos los medicamentos como le indicó el médico. °· Concurra a las visitas prenatales regulares. °· Coma y beba con moderación si cree que está en trabajo de parto. °· Si las contracciones de Braxton Hicks le provocan incomodidad: °¨ Cambie de posición: si está acostada o descansando, camine; si está caminando, descanse. °¨ Siéntese y descanse en una bañera con agua tibia. °¨ Beba 2 o 3 vasos de agua. La deshidratación puede provocar contracciones. °¨ Respire lenta y profundamente varias veces por hora. °¿CUÁNDO DEBO BUSCAR ASISTENCIA MÉDICA INMEDIATA? °Solicite atención médica de inmediato si: °· Las contracciones se intensifican, se hacen más regulares y cercanas entre sí. °· Tiene una pérdida de líquido por la vagina. °· Tiene fiebre. °· Elimina mucosidad manchada con sangre. °· Tiene una hemorragia vaginal abundante. °· Tiene dolor abdominal permanente. °· Tiene un dolor en la zona lumbar que nunca tuvo antes. °· Siente que la cabeza del bebé empuja hacia abajo y ejerce presión en la zona pélvica. °· El bebé no se mueve tanto como solía. °  °Esta información no tiene como fin   reemplazar el consejo del médico. Asegúrese de hacerle al médico cualquier pregunta que tenga. °  °Document Released: 11/01/2004 Document Revised: 01/27/2013 °Elsevier Interactive Patient Education ©2016 Elsevier Inc. ° °

## 2015-02-06 NOTE — L&D Delivery Note (Addendum)
Delivery Summary for [facility]  Labor Events:   Preterm labor:   Rupture date:   Rupture time:   Rupture type: Bulging bag of water  Fluid Color:   Induction:   Augmentation:   Complications:   Cervical ripening:          Delivery:   Episiotomy:   Lacerations:   Repair suture:   Repair # of packets:   Blood loss (ml):    Information for the patient's newborn:  Dmiyah, Liscano [[id number]]    Delivery 03/16/2015 6:15 AM by  Vaginal, Spontaneous Delivery Sex:  female Gestational Age: [redacted]w[redacted]d Delivery Clinician:  Hildred Laser Living?: Yes        APGARS  One minute Five minutes Ten minutes  Skin color: 0   1      Heart rate: 2   2      Grimace: 2   2      Muscle tone: 2   2      Breathing: 2   2      Totals: 8  9      Presentation/position: Vertex     Resuscitation: None  Cord information: 3 vessels   Disposition of cord blood:     Blood gases sent?  Complications:   Placenta: Delivered: 03/16/2015 6:20 AM  Spontaneous  Intact appearance Newborn Measurements: Weight: 7 lb 3.3 oz (3270 g)  Height: 19.88"  Head circumference:    Chest circumference:    Other providers: Delivery Nurse Transition RN Karlton Lemon  Additional  information: Forceps:   Vacuum:   Breech:   Observed anomalies       Delivery Note At 6:15 AM a viable female was delivered via Vaginal, Spontaneous Delivery (Presentation: vertex ; Right Occiput Anterior).  APGAR: 8, 9; weight 7 lb 3.3 oz (3270 g).   Placenta status: Intact, Spontaneous.  Cord: 3 vessels with the following complications: none .  Cord pH: not obtained  Anesthesia: Epidural  Episiotomy:   Lacerations: 2nd degree;Perineal;1st degree;Vaginal Suture Repair: 2.0 vicryl Est. Blood Loss (mL):  300  Mom to postpartum.  Baby to Couplet care / Skin to Skin.  Hildred Laser 03/16/2015, 10:01 AM

## 2015-02-09 ENCOUNTER — Telehealth: Payer: Self-pay | Admitting: Obstetrics and Gynecology

## 2015-02-09 NOTE — Telephone Encounter (Signed)
PT IS 36 WEEKS AND SHE IS HAVING CRAMPING AND PAIN STARTED THIS MORNING, SHE WENT TO WORK AND IS STILL HAVING THE PAIN AND CRAMPING, NO BLEEDING, NO URGENCY TO URINATE, SHE WANTED A CALL BACK TO MAKE SURE IF THIS IS NORMAL OR NOT.

## 2015-02-09 NOTE — Telephone Encounter (Signed)
Pt called and states she has been having cramping today off and off. Pt notes that she has been on her feet all day at work and infrequently gets to rest. Pt denies bleeding, leaking of fluid, or contractions. Advised pt on the need to hydrate adequately and take Tylenol as needed for pain. Advised pt on labor precautions. Will provide note for work at this weeks visit.

## 2015-02-11 ENCOUNTER — Ambulatory Visit (INDEPENDENT_AMBULATORY_CARE_PROVIDER_SITE_OTHER): Payer: PRIVATE HEALTH INSURANCE | Admitting: Obstetrics and Gynecology

## 2015-02-11 VITALS — BP 120/67 | HR 80 | Wt 155.4 lb

## 2015-02-11 DIAGNOSIS — Z3493 Encounter for supervision of normal pregnancy, unspecified, third trimester: Secondary | ICD-10-CM

## 2015-02-11 DIAGNOSIS — Z113 Encounter for screening for infections with a predominantly sexual mode of transmission: Secondary | ICD-10-CM

## 2015-02-11 DIAGNOSIS — Z3483 Encounter for supervision of other normal pregnancy, third trimester: Secondary | ICD-10-CM

## 2015-02-11 DIAGNOSIS — Z36 Encounter for antenatal screening of mother: Secondary | ICD-10-CM

## 2015-02-11 DIAGNOSIS — Z369 Encounter for antenatal screening, unspecified: Secondary | ICD-10-CM

## 2015-02-11 DIAGNOSIS — Z3492 Encounter for supervision of normal pregnancy, unspecified, second trimester: Secondary | ICD-10-CM | POA: Insufficient documentation

## 2015-02-11 LAB — POCT URINALYSIS DIPSTICK
Bilirubin, UA: NEGATIVE
Glucose, UA: NEGATIVE
Ketones, UA: NEGATIVE
NITRITE UA: NEGATIVE
PH UA: 7
Protein, UA: NEGATIVE
RBC UA: NEGATIVE
Spec Grav, UA: 1.01
Urobilinogen, UA: NEGATIVE

## 2015-02-11 NOTE — Progress Notes (Signed)
ROB: Patient doing well, notes Braxton Hicks ctx. 3rd trimester labs performed today.  RTC in 1 week.  PTL precautions given. Laukaitis Orson SlickJacqui, [facility]) Interpreter Services for Spanish used for today's visit.

## 2015-02-16 LAB — GC/CHLAMYDIA PROBE AMP
Chlamydia trachomatis, NAA: NEGATIVE
Neisseria gonorrhoeae by PCR: NEGATIVE

## 2015-02-16 LAB — CULTURE, BETA STREP (GROUP B ONLY)

## 2015-02-17 ENCOUNTER — Ambulatory Visit (INDEPENDENT_AMBULATORY_CARE_PROVIDER_SITE_OTHER): Payer: PRIVATE HEALTH INSURANCE | Admitting: Obstetrics and Gynecology

## 2015-02-17 ENCOUNTER — Encounter: Payer: Self-pay | Admitting: Obstetrics and Gynecology

## 2015-02-17 VITALS — BP 108/65 | HR 89 | Wt 155.2 lb

## 2015-02-17 DIAGNOSIS — Z3493 Encounter for supervision of normal pregnancy, unspecified, third trimester: Secondary | ICD-10-CM

## 2015-02-17 DIAGNOSIS — Z36 Encounter for antenatal screening of mother: Secondary | ICD-10-CM

## 2015-02-17 DIAGNOSIS — Z3685 Encounter for antenatal screening for Streptococcus B: Secondary | ICD-10-CM

## 2015-02-17 DIAGNOSIS — B951 Streptococcus, group B, as the cause of diseases classified elsewhere: Secondary | ICD-10-CM

## 2015-02-17 LAB — POCT URINALYSIS DIPSTICK
Bilirubin, UA: NEGATIVE
Blood, UA: NEGATIVE
GLUCOSE UA: NEGATIVE
Ketones, UA: NEGATIVE
NITRITE UA: NEGATIVE
Protein, UA: NEGATIVE
SPEC GRAV UA: 1.015
UROBILINOGEN UA: 0.2
pH, UA: 7

## 2015-02-17 NOTE — Progress Notes (Signed)
Rob- gc/c- neg- gbs needs to repeat d/t sent in expired tube. Pt is having itching on breast, hands, and tummy. More so at nite. No rash. Husband will interpret today. Physical exam: Skin without rash. Recommend hydrocortisone cream 4 times a day as needed. Strep culture is repeated today.

## 2015-02-20 DIAGNOSIS — B951 Streptococcus, group B, as the cause of diseases classified elsewhere: Secondary | ICD-10-CM | POA: Insufficient documentation

## 2015-02-20 LAB — STREP GP B NAA: Strep Gp B NAA: POSITIVE — AB

## 2015-02-24 ENCOUNTER — Ambulatory Visit (INDEPENDENT_AMBULATORY_CARE_PROVIDER_SITE_OTHER): Payer: PRIVATE HEALTH INSURANCE | Admitting: Obstetrics and Gynecology

## 2015-02-24 VITALS — BP 115/66 | HR 85 | Wt 154.5 lb

## 2015-02-24 DIAGNOSIS — Z3483 Encounter for supervision of other normal pregnancy, third trimester: Secondary | ICD-10-CM

## 2015-02-24 DIAGNOSIS — O34219 Maternal care for unspecified type scar from previous cesarean delivery: Secondary | ICD-10-CM

## 2015-02-24 DIAGNOSIS — Z3493 Encounter for supervision of normal pregnancy, unspecified, third trimester: Secondary | ICD-10-CM

## 2015-02-24 DIAGNOSIS — B951 Streptococcus, group B, as the cause of diseases classified elsewhere: Secondary | ICD-10-CM

## 2015-02-24 LAB — POCT URINALYSIS DIPSTICK
BILIRUBIN UA: NEGATIVE
Blood, UA: NEGATIVE
GLUCOSE UA: NEGATIVE
KETONES UA: NEGATIVE
Nitrite, UA: NEGATIVE
Protein, UA: NEGATIVE
SPEC GRAV UA: 1.01
Urobilinogen, UA: NEGATIVE
pH, UA: 7.5

## 2015-02-26 NOTE — Progress Notes (Signed)
ROB: Notes occasional mild contractions.  Labor precautions given.  Still desires TOLAC. 36 labs reviewed, is GBS +, will need prophylaxis.  RTC in 1 week.

## 2015-03-03 ENCOUNTER — Ambulatory Visit (INDEPENDENT_AMBULATORY_CARE_PROVIDER_SITE_OTHER): Payer: PRIVATE HEALTH INSURANCE | Admitting: Obstetrics and Gynecology

## 2015-03-03 VITALS — BP 111/67 | HR 88 | Wt 159.3 lb

## 2015-03-03 DIAGNOSIS — B951 Streptococcus, group B, as the cause of diseases classified elsewhere: Secondary | ICD-10-CM

## 2015-03-03 DIAGNOSIS — O34219 Maternal care for unspecified type scar from previous cesarean delivery: Secondary | ICD-10-CM

## 2015-03-03 DIAGNOSIS — Z3493 Encounter for supervision of normal pregnancy, unspecified, third trimester: Secondary | ICD-10-CM

## 2015-03-03 DIAGNOSIS — Z3483 Encounter for supervision of other normal pregnancy, third trimester: Secondary | ICD-10-CM

## 2015-03-03 LAB — POCT URINALYSIS DIPSTICK
BILIRUBIN UA: NEGATIVE
Glucose, UA: NEGATIVE
Ketones, UA: NEGATIVE
NITRITE UA: NEGATIVE
PH UA: 6.5
PROTEIN UA: NEGATIVE
RBC UA: NEGATIVE
Spec Grav, UA: 1.01
Urobilinogen, UA: NEGATIVE

## 2015-03-03 NOTE — Patient Instructions (Signed)
(Group B Streptococcus Infection During Pregnancy) El estreptococo del grupo B (GBS, por sus siglas en ingls) es un tipo de bacteria que se encuentra en las mujeres sanas. No es el mismo que la bacteria que causa faringitis estreptoccica. Puede tener estreptococo del grupoB en la vagina, el recto o la vejiga. El estreptococo del grupoB no se transmite por contacto sexual; sin embargo, un beb puede contagiarse durante el parto, lo que puede ser peligroso para el nio. El estreptococo del grupoB no es peligroso para usted y, generalmente, no provoca sntomas. El mdico puede hacerle anlisis de deteccin del estreptococo del grupoB entre la semana 35 y la 37de embarazo. Esta bacteria es peligrosa solamente durante el parto, de modo que no es necesario hacer pruebas de deteccin con anterioridad. Es posible tener estreptococo del grupoB durante el embarazo y no transmitrselo nunca al beb. Si los resultados de los anlisis de deteccin del estreptococo del grupoB son positivos, el mdico puede recomendar que se le administren antibiticos durante el parto para asegurarse de que el beb est sano. FACTORES DE RIESGO Tiene ms probabilidades de transmitirle el estreptococo del grupoB al beb si:   Se le rompe la bolsa de aguas (ruptura de membranas) o si el trabajo de parto comienza antes de las 37semanas.  Se le rompe la bolsa 18horas antes del parto.  Transmiti el estreptococo del grupoB en un embarazo anterior.  Tiene una infeccin de las vas urinarias causada por el estreptococo del grupoB en cualquier momento durante el embarazo.  Tiene fiebre durante el trabajo de parto. SNTOMAS La mayora de las mujeres que tienen estreptococo del grupoB no presentan sntomas. Si tiene una infeccin de las vas urinarias causada por el estreptococo del grupoB, es posible que orine con frecuencia o que tenga dolor al orinar y fiebre. Generalmente, los bebs que tienen estreptococo del grupoB  presentan sntomas en el trmino de los 7das posteriores al nacimiento. Los sntomas pueden incluir:   Problemas respiratorios.  Problemas cardacos y de presin arterial.  Problemas digestivos y renales. DIAGNSTICO Se recomienda que todas las embarazadas se hagan exmenes de rutina para la deteccin del estreptococo del grupoB. El mdico toma una muestra de secrecin de la vagina y el recto con un hisopo, que luego se enva al laboratorio para detectar la presencia de estreptococo del grupoB. Tambin pueden tomarle una muestra de orina para controlar si hay bacterias.  TRATAMIENTO Si el resultado de los anlisis de deteccin del estreptococo del grupoB es positivo, tal vez deba recibir tratamiento con un antibitico durante el trabajo de parto. En cuanto comience el trabajo de parto o se produzca la ruptura de las membranas, recibir el antibitico a travs de una va intravenosa. Seguir recibiendo este medicamento hasta despus del parto. No necesita antibiticos si el parto es por cesrea. Si el beb muestra signos o sntomas de estreptococo del grupoB despus del parto, tambin puede recibir tratamiento con antibiticos. INSTRUCCIONES PARA EL CUIDADO EN EL HOGAR   Tome todos los medicamentos tal como el mdico le recet. Tome los medicamentos solamente como se lo hayan indicado.  Contine con las visitas y cuidados prenatales.  Cumpla con todas las visitas de control. SOLICITE ATENCIN MDICA SI:   Siente dolor al orinar.  Tiene necesidad de orinar con frecuencia.  Tiene fiebre. SOLICITE ATENCIN MDICA DE INMEDIATO SI:   Se produce la ruptura de las membranas.  Comienza el trabajo de parto.   Esta informacin no tiene como fin reemplazar el consejo del mdico. Asegrese   de hacerle al mdico cualquier pregunta que tenga.   Document Released: 07/11/2007 Document Revised: 01/27/2013 Elsevier Interactive Patient Education 2016 Elsevier Inc.  

## 2015-03-03 NOTE — Progress Notes (Signed)
ROB: Patient notes increased thin clear discharge.  Nitrazine negative, no SROM. Labor precautions given.  Further discussion had on GBS + status at patient's request. RTC in 1 week.

## 2015-03-09 ENCOUNTER — Ambulatory Visit (INDEPENDENT_AMBULATORY_CARE_PROVIDER_SITE_OTHER): Payer: Managed Care, Other (non HMO) | Admitting: Obstetrics and Gynecology

## 2015-03-09 VITALS — BP 119/72 | HR 88 | Wt 159.1 lb

## 2015-03-09 DIAGNOSIS — Z3483 Encounter for supervision of other normal pregnancy, third trimester: Secondary | ICD-10-CM | POA: Diagnosis not present

## 2015-03-09 DIAGNOSIS — B951 Streptococcus, group B, as the cause of diseases classified elsewhere: Secondary | ICD-10-CM

## 2015-03-09 DIAGNOSIS — O34219 Maternal care for unspecified type scar from previous cesarean delivery: Secondary | ICD-10-CM

## 2015-03-09 DIAGNOSIS — Z3493 Encounter for supervision of normal pregnancy, unspecified, third trimester: Secondary | ICD-10-CM

## 2015-03-09 LAB — POCT URINALYSIS DIPSTICK
Bilirubin, UA: NEGATIVE
Blood, UA: NEGATIVE
Glucose, UA: NEGATIVE
KETONES UA: NEGATIVE
Nitrite, UA: NEGATIVE
PH UA: 6
PROTEIN UA: NEGATIVE
SPEC GRAV UA: 1.01
UROBILINOGEN UA: NEGATIVE

## 2015-03-09 NOTE — Progress Notes (Signed)
ROB: Patient notes increased discharge, yellow green. Wet prep performed negative for pathogens.  Also notes ctx q 1 hour. Labor precautions given.  RTC in 1 week.

## 2015-03-14 ENCOUNTER — Telehealth: Payer: Self-pay

## 2015-03-14 NOTE — Telephone Encounter (Signed)
Pt calls and states she has not felt any fetal movement since last night. Advised pt to go to L&D immediately for evaluation.

## 2015-03-16 ENCOUNTER — Encounter: Payer: Self-pay | Admitting: *Deleted

## 2015-03-16 ENCOUNTER — Encounter: Payer: PRIVATE HEALTH INSURANCE | Admitting: Obstetrics and Gynecology

## 2015-03-16 ENCOUNTER — Inpatient Hospital Stay
Admission: EM | Admit: 2015-03-16 | Discharge: 2015-03-18 | DRG: 775 | Disposition: A | Discharge status: Home / Self Care | Payer: Managed Care, Other (non HMO) | Attending: Obstetrics and Gynecology | Admitting: Obstetrics and Gynecology

## 2015-03-16 ENCOUNTER — Inpatient Hospital Stay: Payer: Managed Care, Other (non HMO) | Admitting: Anesthesiology

## 2015-03-16 DIAGNOSIS — Z3A40 40 weeks gestation of pregnancy: Secondary | ICD-10-CM | POA: Diagnosis not present

## 2015-03-16 DIAGNOSIS — O368130 Decreased fetal movements, third trimester, not applicable or unspecified: Secondary | ICD-10-CM | POA: Diagnosis present

## 2015-03-16 DIAGNOSIS — O480 Post-term pregnancy: Secondary | ICD-10-CM | POA: Diagnosis present

## 2015-03-16 DIAGNOSIS — O9081 Anemia of the puerperium: Secondary | ICD-10-CM | POA: Diagnosis not present

## 2015-03-16 DIAGNOSIS — O34219 Maternal care for unspecified type scar from previous cesarean delivery: Secondary | ICD-10-CM | POA: Diagnosis present

## 2015-03-16 DIAGNOSIS — R42 Dizziness and giddiness: Secondary | ICD-10-CM | POA: Diagnosis not present

## 2015-03-16 DIAGNOSIS — O99824 Streptococcus B carrier state complicating childbirth: Secondary | ICD-10-CM | POA: Diagnosis present

## 2015-03-16 DIAGNOSIS — Z3483 Encounter for supervision of other normal pregnancy, third trimester: Secondary | ICD-10-CM

## 2015-03-16 DIAGNOSIS — D649 Anemia, unspecified: Secondary | ICD-10-CM | POA: Diagnosis not present

## 2015-03-16 DIAGNOSIS — IMO0001 Reserved for inherently not codable concepts without codable children: Secondary | ICD-10-CM

## 2015-03-16 LAB — CBC
HEMATOCRIT: 37.5 % (ref 35.0–47.0)
Hemoglobin: 12.8 g/dL (ref 12.0–16.0)
MCH: 30.4 pg (ref 26.0–34.0)
MCHC: 34.2 g/dL (ref 32.0–36.0)
MCV: 89 fL (ref 80.0–100.0)
PLATELETS: 152 10*3/uL (ref 150–440)
RBC: 4.21 MIL/uL (ref 3.80–5.20)
RDW: 14.2 % (ref 11.5–14.5)
WBC: 10 10*3/uL (ref 3.6–11.0)

## 2015-03-16 LAB — RAPID HIV SCREEN (HIV 1/2 AB+AG)
HIV 1/2 ANTIBODIES: NONREACTIVE
HIV-1 P24 Antigen - HIV24: NONREACTIVE

## 2015-03-16 LAB — TYPE AND SCREEN
ABO/RH(D): A POS
Antibody Screen: NEGATIVE

## 2015-03-16 LAB — ABO/RH: ABO/RH(D): A POS

## 2015-03-16 MED ORDER — DIPHENHYDRAMINE HCL 50 MG/ML IJ SOLN: 12.5000 mg | INTRAMUSCULAR | Status: DC | PRN

## 2015-03-16 MED ORDER — LIDOCAINE-EPINEPHRINE (PF) 1.5 %-1:200000 IJ SOLN
INTRAMUSCULAR | Status: DC | PRN
  Administered 2015-03-16: 3 mL via EPIDURAL

## 2015-03-16 MED ORDER — BUTORPHANOL TARTRATE 1 MG/ML IJ SOLN
INTRAMUSCULAR | Status: AC
  Administered 2015-03-16: 1 mg
  Filled 2015-03-16: qty 1

## 2015-03-16 MED ORDER — LIDOCAINE HCL (PF) 1 % IJ SOLN
INTRAMUSCULAR | Status: AC
  Administered 2015-03-16: 1 mL via INTRADERMAL
  Filled 2015-03-16: qty 30

## 2015-03-16 MED ORDER — OXYTOCIN BOLUS FROM INFUSION: 500.0000 mL | INTRAVENOUS | Status: DC

## 2015-03-16 MED ORDER — NALBUPHINE HCL 10 MG/ML IJ SOLN: 5.0000 mg | Freq: Once | INTRAMUSCULAR | Status: DC | PRN

## 2015-03-16 MED ORDER — AMMONIA AROMATIC IN INHA
RESPIRATORY_TRACT | Status: AC
  Filled 2015-03-16: qty 10

## 2015-03-16 MED ORDER — ONDANSETRON HCL 4 MG/2ML IJ SOLN: 4.0000 mg | Freq: Four times a day (QID) | INTRAMUSCULAR | Status: DC | PRN

## 2015-03-16 MED ORDER — FENTANYL 2.5 MCG/ML W/ROPIVACAINE 0.2% IN NS 100 ML EPIDURAL INFUSION (ARMC-ANES)
EPIDURAL | Status: AC
  Administered 2015-03-16: 10 mL/h via EPIDURAL
  Filled 2015-03-16: qty 100

## 2015-03-16 MED ORDER — BUPIVACAINE HCL (PF) 0.25 % IJ SOLN
INTRAMUSCULAR | Status: DC | PRN
  Administered 2015-03-16: 5 mL via EPIDURAL

## 2015-03-16 MED ORDER — OXYCODONE-ACETAMINOPHEN 5-325 MG PO TABS
1.0000 | ORAL_TABLET | ORAL | Status: DC | PRN
  Administered 2015-03-16: 1 via ORAL
  Filled 2015-03-16: qty 1

## 2015-03-16 MED ORDER — OXYCODONE-ACETAMINOPHEN 5-325 MG PO TABS: 1.0000 | ORAL_TABLET | ORAL | Status: DC | PRN

## 2015-03-16 MED ORDER — OXYTOCIN 40 UNITS IN LACTATED RINGERS INFUSION - SIMPLE MED
INTRAVENOUS | Status: AC
  Administered 2015-03-16: 2 [IU]
  Filled 2015-03-16: qty 1000

## 2015-03-16 MED ORDER — SODIUM CHLORIDE 0.9% FLUSH: 3.0000 mL | INTRAVENOUS | Status: DC | PRN

## 2015-03-16 MED ORDER — PRENATAL MULTIVITAMIN CH
1.0000 | ORAL_TABLET | Freq: Every day | ORAL | Status: DC
  Administered 2015-03-16 – 2015-03-18 (×3): 1 via ORAL
  Filled 2015-03-16 (×3): qty 1

## 2015-03-16 MED ORDER — OXYTOCIN 10 UNIT/ML IJ SOLN
2.5000 [IU]/h | INTRAVENOUS | Status: DC
  Filled 2015-03-16: qty 4

## 2015-03-16 MED ORDER — LIDOCAINE HCL (PF) 1 % IJ SOLN
INTRAMUSCULAR | Status: AC
  Filled 2015-03-16: qty 30

## 2015-03-16 MED ORDER — OXYTOCIN 10 UNIT/ML IJ SOLN
INTRAMUSCULAR | Status: AC
  Filled 2015-03-16: qty 2

## 2015-03-16 MED ORDER — LACTATED RINGERS IV SOLN
500.0000 mL | INTRAVENOUS | Status: DC | PRN
  Administered 2015-03-16: 500 mL via INTRAVENOUS

## 2015-03-16 MED ORDER — DIBUCAINE 1 % RE OINT: 1.0000 "application " | TOPICAL_OINTMENT | RECTAL | Status: DC | PRN

## 2015-03-16 MED ORDER — TETANUS-DIPHTH-ACELL PERTUSSIS 5-2.5-18.5 LF-MCG/0.5 IM SUSP: 0.5000 mL | Freq: Once | INTRAMUSCULAR | Status: DC

## 2015-03-16 MED ORDER — IBUPROFEN 600 MG PO TABS
600.0000 mg | ORAL_TABLET | Freq: Four times a day (QID) | ORAL | Status: DC
Start: 2015-03-16 — End: 2015-03-17
  Administered 2015-03-16 (×2): 600 mg via ORAL
  Filled 2015-03-16 (×2): qty 1

## 2015-03-16 MED ORDER — FERROUS SULFATE 325 (65 FE) MG PO TABS
325.0000 mg | ORAL_TABLET | Freq: Two times a day (BID) | ORAL | Status: DC
  Administered 2015-03-16 – 2015-03-18 (×2): 325 mg via ORAL
  Filled 2015-03-16 (×2): qty 1

## 2015-03-16 MED ORDER — DIPHENHYDRAMINE HCL 25 MG PO CAPS: 25.0000 mg | ORAL_CAPSULE | ORAL | Status: DC | PRN

## 2015-03-16 MED ORDER — ACETAMINOPHEN 325 MG PO TABS: 650.0000 mg | ORAL_TABLET | ORAL | Status: DC | PRN

## 2015-03-16 MED ORDER — BENZOCAINE-MENTHOL 20-0.5 % EX AERO
1.0000 "application " | INHALATION_SPRAY | CUTANEOUS | Status: DC | PRN
  Administered 2015-03-18 (×2): 1 via TOPICAL
  Filled 2015-03-16 (×2): qty 56

## 2015-03-16 MED ORDER — NALOXONE HCL 2 MG/2ML IJ SOSY
1.0000 ug/kg/h | PREFILLED_SYRINGE | INTRAVENOUS | Status: DC | PRN
  Filled 2015-03-16: qty 2

## 2015-03-16 MED ORDER — DIPHENHYDRAMINE HCL 25 MG PO CAPS: 25.0000 mg | ORAL_CAPSULE | Freq: Four times a day (QID) | ORAL | Status: DC | PRN

## 2015-03-16 MED ORDER — SENNOSIDES-DOCUSATE SODIUM 8.6-50 MG PO TABS
2.0000 | ORAL_TABLET | ORAL | Status: DC
  Administered 2015-03-16 – 2015-03-18 (×2): 2 via ORAL
  Filled 2015-03-16 (×2): qty 2

## 2015-03-16 MED ORDER — LANOLIN HYDROUS EX OINT: TOPICAL_OINTMENT | CUTANEOUS | Status: DC | PRN

## 2015-03-16 MED ORDER — LIDOCAINE HCL (PF) 1 % IJ SOLN: 30.0000 mL | INTRAMUSCULAR | Status: DC | PRN

## 2015-03-16 MED ORDER — NALBUPHINE HCL 10 MG/ML IJ SOLN: 5.0000 mg | INTRAMUSCULAR | Status: DC | PRN

## 2015-03-16 MED ORDER — SODIUM CHLORIDE 0.9 % IV SOLN
INTRAVENOUS | Status: AC
  Administered 2015-03-16: 2 g via INTRAVENOUS
  Filled 2015-03-16: qty 2000

## 2015-03-16 MED ORDER — ONDANSETRON HCL 4 MG PO TABS: 4.0000 mg | ORAL_TABLET | ORAL | Status: DC | PRN

## 2015-03-16 MED ORDER — CITRIC ACID-SODIUM CITRATE 334-500 MG/5ML PO SOLN: 30.0000 mL | ORAL | Status: DC | PRN

## 2015-03-16 MED ORDER — LACTATED RINGERS IV SOLN: INTRAVENOUS | Status: DC

## 2015-03-16 MED ORDER — SODIUM CHLORIDE 0.9 % IV SOLN
2.0000 g | Freq: Once | INTRAVENOUS | Status: AC
  Administered 2015-03-16: 2 g via INTRAVENOUS

## 2015-03-16 MED ORDER — OXYTOCIN 40 UNITS IN LACTATED RINGERS INFUSION - SIMPLE MED
INTRAVENOUS | Status: AC
  Filled 2015-03-16: qty 1000

## 2015-03-16 MED ORDER — AMMONIA AROMATIC IN INHA
RESPIRATORY_TRACT | Status: AC
  Administered 2015-03-16: 10:00:00
  Filled 2015-03-16: qty 10

## 2015-03-16 MED ORDER — WITCH HAZEL-GLYCERIN EX PADS: 1.0000 "application " | MEDICATED_PAD | CUTANEOUS | Status: DC | PRN

## 2015-03-16 MED ORDER — ZOLPIDEM TARTRATE 5 MG PO TABS: 5.0000 mg | ORAL_TABLET | Freq: Every evening | ORAL | Status: DC | PRN

## 2015-03-16 MED ORDER — MISOPROSTOL 200 MCG PO TABS
ORAL_TABLET | ORAL | Status: AC
  Filled 2015-03-16: qty 4

## 2015-03-16 MED ORDER — OXYCODONE-ACETAMINOPHEN 5-325 MG PO TABS: 2.0000 | ORAL_TABLET | ORAL | Status: DC | PRN

## 2015-03-16 MED ORDER — ONDANSETRON HCL 4 MG/2ML IJ SOLN
INTRAMUSCULAR | Status: AC
  Filled 2015-03-16: qty 2

## 2015-03-16 MED ORDER — FENTANYL 2.5 MCG/ML W/ROPIVACAINE 0.2% IN NS 100 ML EPIDURAL INFUSION (ARMC-ANES): 10.0000 mL/h | EPIDURAL | Status: DC

## 2015-03-16 MED ORDER — ONDANSETRON HCL 4 MG/2ML IJ SOLN: 4.0000 mg | INTRAMUSCULAR | Status: DC | PRN

## 2015-03-16 MED ORDER — OXYTOCIN 10 UNIT/ML IJ SOLN
INTRAMUSCULAR | Status: DC
Start: 2015-03-16 — End: 2015-03-16
  Filled 2015-03-16: qty 2

## 2015-03-16 MED ORDER — ONDANSETRON HCL 4 MG/2ML IJ SOLN: 4.0000 mg | Freq: Three times a day (TID) | INTRAMUSCULAR | Status: DC | PRN

## 2015-03-16 MED ORDER — AMMONIA AROMATIC IN INHA
RESPIRATORY_TRACT | Status: AC
  Filled 2015-03-16: qty 20

## 2015-03-16 MED ORDER — SIMETHICONE 80 MG PO CHEW: 80.0000 mg | CHEWABLE_TABLET | ORAL | Status: DC | PRN

## 2015-03-16 MED ORDER — NALOXONE HCL 0.4 MG/ML IJ SOLN: 0.4000 mg | INTRAMUSCULAR | Status: DC | PRN

## 2015-03-16 NOTE — Progress Notes (Signed)
RN to the bedside to ambulate patient to [facility] for post delivery void.

## 2015-03-16 NOTE — H&P (Signed)
Obstetric History and Physical  Laura Woods is a 30 y.o. [phone] with IUP at [redacted]w[redacted]d presenting for contractions. Patient with h/o C-section x 1 (for breech presentation ), desiring TOLAC. Patient states she has been having  regular, every 2-3 minutes contractions, none vaginal bleeding, intact membranes, with decreased  fetal movement.    Prenatal Course Source of Care: [facility] with onset of care at 13.6 weeks Pregnancy complications or risks: Patient Active Problem List   Diagnosis Date Noted  . Positive GBS test 02/20/2015  . Supervision of normal pregnancy in third trimester 02/11/2015  . H/O cesarean section complicating pregnancy 11/10/2014   She plans to breastfeed She desires bilateral tubal ligation for postpartum contraception.   Prenatal labs and studies: ABO, Rh: --/--/A POS (02/08 0054) Antibody: NEG (02/08 0054) Rubella: <20.0 (07/27 1515) RPR: Non Reactive (07/27 1515)  HBsAg: Negative (07/27 1515)  HIV: Non Reactive (07/27 1515)  JYN:WGNFAOZH (01/12 0430) 1 hr Glucola  normal Genetic screening normal (NT measurement borderline elevated but 1st trimester serum screen normal) Anatomy US normal  Prenatal Transfer Tool  Maternal Diabetes: No Genetic Screening: Normal Maternal Ultrasounds/Referrals: Normal Fetal Ultrasounds or other Referrals:  None Maternal Substance Abuse:  No Significant Maternal Medications:  None Significant Maternal Lab Results: Lab values include: Group B Strep positive  History reviewed. No pertinent past medical history.  Past Surgical History  Procedure Laterality Date  . Colon surgery    . Cesarean section      OB History  Gravida Para Term Preterm AB SAB TAB Ectopic Multiple Living  0 2    # Outcome Date GA Lbr Len/2nd Weight Sex Delivery Anes PTL Lv  2 Term 03/16/15 [redacted]w[redacted]d 06:08 / 03:07 7 lb 3.3 oz (3.27 kg) M Vag-Spont EPI  Y  1 Term 09/29/09 [redacted]w[redacted]d  7 lb (3.175 kg) M CS-LTranv   Y   Complications: Failure to Progress in Second Stage    Obstetric Comments  Asynclitism of fetal head    Social History   Social History  . Marital Status: Single    Spouse Name: N/A  . Number of Children: N/A  . Years of Education: N/A   Social History Main Topics  . Smoking status: Never Smoker   . Smokeless tobacco: Never Used  . Alcohol Use: No  . Drug Use: No  . Sexual Activity: Yes    Birth Control/ Protection: Condom     Comment: Pregnant   Other Topics Concern  . None   Social History Narrative    Family History  Problem Relation Age of Onset  . Healthy Mother   . Healthy Father     Prescriptions prior to admission  Medication Sig Dispense Refill Last Dose  . Prenat-FeAsp-Meth-FA-DHA w/o A (PRENATE DHA) 18-0.6-0.4-300 MG CAPS 1 Daily 30 capsule 6 03/15/2015 at Unknown time    No Known Allergies  Review of Systems: Negative except for what is mentioned in HPI.  Physical Exam: BP 106/51 mmHg  Pulse 78  Temp(Src) 98.4 F (36.9 C) (Oral)  Resp 16  Ht  (1.6 m)  Wt 159 lb (72.122 kg)  BMI 28.17 kg/m2  SpO2 100%  LMP 06/25/2014  Breastfeeding? Unknown CONSTITUTIONAL: Well-developed, well-nourished female in no acute distress.  HENT:  Normocephalic, atraumatic, External right and left ear normal. Oropharynx is clear and moist EYES: Conjunctivae and EOM are normal. Pupils are equal, round, and reactive to light. No scleral icterus.  NECK:  Normal range of motion, supple, no masses SKIN: Skin is warm and dry. No rash noted. Not diaphoretic. No erythema. No pallor. NEUROLOGIC: Alert and oriented to person, place, and time. Normal reflexes, muscle tone coordination. No cranial nerve deficit noted. PSYCHIATRIC: Normal mood and affect. Normal behavior. Normal judgment and thought content. CARDIOVASCULAR: Normal heart rate noted, regular rhythm RESPIRATORY: Effort and breath sounds normal, no problems with respiration noted ABDOMEN: Soft, nontender,  nondistended, gravid. MUSCULOSKELETAL: Normal range of motion. No edema and no tenderness. 2+ distal pulses.  Cervical Exam: Dilatation 6 cm   Effacement 90%   Station -1 (on admission).  Currently 8/90/-1  Presentation: cephalic FHT:  Baseline rate 145 bpm   Variability moderate  Accelerations present   Decelerations variable (intermittent) Contractions: Every 2-3 mins   Pertinent Labs/Studies:   Results for orders placed or performed during the hospital encounter of 03/16/15 (from the past 24 hour(s))  CBC     Status: None   Collection Time: 03/16/15 12:54 AM  Result Value Ref Range   WBC 10.0 3.6 - 11.0 K/uL   RBC 4.21 3.80 - 5.20 MIL/uL   Hemoglobin 12.8 12.0 - 16.0 g/dL   HCT 32.4 40.1 - 02.7 %   MCV 89.0 80.0 - 100.0 fL   MCH 30.4 26.0 - 34.0 pg   MCHC 34.2 32.0 - 36.0 g/dL   RDW 25.3 66.4 - 40.3 %   Platelets 152 150 - 440 K/uL  Type and screen     Status: None   Collection Time: 03/16/15 12:54 AM  Result Value Ref Range   ABO/RH(D) A POS    Antibody Screen NEG    Sample Expiration 03/19/2015     Assessment : Laura Woods is a 30 y.o. G2P2002 at [redacted]w[redacted]d being admitted for labor, h/o prior C-section x 1, desires TOLAC.  Plan: Labor: Expectant management.  IAugmentation as needed, per protocol FWB: Reassuring fetal heart tracing.  GBS positive.  Treated with initial dose of Ampicillin.   Delivery plan: Hopeful for vaginal delivery.  Patient also desires BTL, will perform postpartum.    Hildred Laser, MD [facility]

## 2015-03-16 NOTE — Progress Notes (Addendum)
RN assisted pt. To BR for void and peri care. Pt. Did not void.  Pt. C/o dizziness, and being nauseous.  Ammonia inhalant given, Pericare given, Zofran given via IV push for nausea.  Pt. Assisted to WC, BP retaken,  109/37, 118/50. Pt. Transferred to PP Room 335. Spouse and family member at the bedside. FFU with small lochia. Report given.

## 2015-03-16 NOTE — Progress Notes (Addendum)
RN to the bedside to assist patient to [facility] for post delivery void.  Pt. Dizzy and unable to stand. Pt. Back to low lying SF position.  RN to give 500 ml LR bolus. BP at 0837 - 96/79, pulse 89.

## 2015-03-16 NOTE — Progress Notes (Signed)
LR bolus given, IV positional, IV pump start and stop, switch IV pump out, still starting and stopping.  IV discontinued and new IV started in right hand with 20 g, bolus resumed. Pt. Asleep, but answers when asked a question. BP 119/69, pulse 78 bpm. RN will attempt to ambulate patient to BR after bolus is complete. Pt. Now (1610) alert and talking to family members, lips pink, pt. Asking for apple juice.

## 2015-03-16 NOTE — Progress Notes (Signed)
Pt. Requesting menu for breakfast, O2 sats 99%.

## 2015-03-16 NOTE — Anesthesia Procedure Notes (Signed)
Epidural Patient location during procedure: OB Start time: 03/16/2015 1:22 AM End time: 03/16/2015 1:23 AM  Staffing Anesthesiologist: Margorie John K Performed by: anesthesiologist   Preanesthetic Checklist Completed: patient identified, site marked, surgical consent, pre-op evaluation, timeout performed, IV checked, risks and benefits discussed and monitors and equipment checked  Epidural Patient position: sitting Prep: Betadine Patient monitoring: heart rate, continuous pulse ox and blood pressure Approach: midline Location: L4-L5 Injection technique: LOR saline  Needle:  Needle type: Tuohy  Needle gauge: 17 G Needle length: 9 cm and 9 Needle insertion depth: 6 cm Catheter type: closed end flexible Catheter size: 19 Gauge Catheter at skin depth: 10 cm Test dose: negative and 1.5% lidocaine with Epi 1:200 K  Assessment Sensory level: T8 Events: blood not aspirated, injection not painful, no injection resistance, negative IV test and no paresthesia  Additional Notes   Patient tolerated the insertion well without immediate complications.Reason for block:procedure for pain

## 2015-03-16 NOTE — Anesthesia Preprocedure Evaluation (Signed)
Anesthesia Evaluation  Patient identified by MRN, date of birth, ID band Patient awake    Reviewed: Allergy & Precautions, H&P , NPO status , Patient's Chart, lab work & pertinent test results  History of Anesthesia Complications Negative for: history of anesthetic complications  Airway Mallampati: III  TM Distance: >3 FB Neck ROM: full    Dental  (+) Poor Dentition   Pulmonary neg pulmonary ROS,    Pulmonary exam normal breath sounds clear to auscultation       Cardiovascular negative cardio ROS Normal cardiovascular exam Rhythm:regular Rate:Normal     Neuro/Psych negative neurological ROS  negative psych ROS   GI/Hepatic negative GI ROS, Neg liver ROS,   Endo/Other  negative endocrine ROS  Renal/GU negative Renal ROS  negative genitourinary   Musculoskeletal   Abdominal   Peds  Hematology negative hematology ROS (+)   Anesthesia Other Findings History reviewed. No pertinent past medical history.  Past Surgical History:   COLON SURGERY                                                 CESAREAN SECTION                                             BMI    Body Mass Index   28.17 kg/m 2      Reproductive/Obstetrics (+) Pregnancy                             Anesthesia Physical Anesthesia Plan  ASA: II  Anesthesia Plan: Epidural   Post-op Pain Management:    Induction:   Airway Management Planned:   Additional Equipment:   Intra-op Plan:   Post-operative Plan:   Informed Consent: I have reviewed the patients History and Physical, chart, labs and discussed the procedure including the risks, benefits and alternatives for the proposed anesthesia with the patient or authorized representative who has indicated his/her understanding and acceptance.   Dental Advisory Given  Plan Discussed with: Anesthesiologist, CRNA and Surgeon  Anesthesia Plan Comments:          Anesthesia Quick Evaluation

## 2015-03-17 ENCOUNTER — Encounter
Admission: EM | Disposition: A | Discharge status: Home / Self Care | Payer: Self-pay | Source: Home / Self Care | Attending: Obstetrics and Gynecology

## 2015-03-17 LAB — CBC
HEMATOCRIT: 28.9 % — AB (ref 35.0–47.0)
Hemoglobin: 9.6 g/dL — ABNORMAL LOW (ref 12.0–16.0)
MCH: 29.5 pg (ref 26.0–34.0)
MCHC: 33.4 g/dL (ref 32.0–36.0)
MCV: 88.4 fL (ref 80.0–100.0)
PLATELETS: 126 10*3/uL — AB (ref 150–440)
RBC: 3.27 MIL/uL — AB (ref 3.80–5.20)
RDW: 14.7 % — AB (ref 11.5–14.5)
WBC: 11.2 10*3/uL — AB (ref 3.6–11.0)

## 2015-03-17 LAB — RPR: RPR Ser Ql: NONREACTIVE

## 2015-03-17 SURGERY — LIGATION, FALLOPIAN TUBE, BILATERAL
Anesthesia: General | Laterality: Bilateral

## 2015-03-17 MED ORDER — IBUPROFEN 600 MG PO TABS
600.0000 mg | ORAL_TABLET | Freq: Four times a day (QID) | ORAL | Status: DC
  Administered 2015-03-17 – 2015-03-18 (×4): 600 mg via ORAL
  Filled 2015-03-17 (×4): qty 1

## 2015-03-17 MED ORDER — METOCLOPRAMIDE HCL 10 MG PO TABS: 10.0000 mg | ORAL_TABLET | Freq: Once | ORAL | Status: DC

## 2015-03-17 MED ORDER — FERROUS SULFATE 325 (65 FE) MG PO TABS: 325.0000 mg | ORAL_TABLET | Freq: Every day | ORAL | Status: DC

## 2015-03-17 MED ORDER — LACTATED RINGERS IV SOLN: INTRAVENOUS | Status: DC

## 2015-03-17 MED ORDER — FAMOTIDINE 20 MG PO TABS: 40.0000 mg | ORAL_TABLET | Freq: Once | ORAL | Status: DC

## 2015-03-17 NOTE — Progress Notes (Signed)
Post Partum Day #1 s/p SVD  Subjective: up ad lib, voiding, tolerating PO and occasional dizziness with ambulation.  Objective: Blood pressure 107/50, pulse 77, temperature 98.5 F (36.9 C), temperature source Oral, resp. rate 18, height  (1.6 m), weight 159 lb (72.122 kg), last menstrual period 06/25/2014, SpO2 99 %, unknown if currently breastfeeding.  Physical Exam:  General: alert and no distress  CVS: S1 and S2 present.  Regular rate and rhythm. Pulm: clear to auscultation bilaterally Lochia: appropriate Uterine Fundus: firm Incision: None DVT Evaluation: No evidence of DVT seen on physical exam. Negative Homan's sign. No cords or calf tenderness. No significant calf/ankle edema.   Recent Labs  03/16/15 0054 03/17/15 0617  HGB 12.8 9.6*  HCT 37.5 28.9*    Assessment/Plan: Plan for discharge tomorrow, Breastfeeding, Circumcision prior to discharge and Contraception initially desired BTL, however canceled this morning.  Now planning on vasectomy. Anemia - mild, due to delivery blood loss. Mildly symptomatic, dizziness with ambulation.  Advised on exerting caution when changing positions, and will treat with PO iron supplements.    LOS: 1 day   Hildred Laser 03/17/2015, 1:00 PM

## 2015-03-17 NOTE — Anesthesia Postprocedure Evaluation (Signed)
Anesthesia Post Note  Patient: Laura Woods  Procedure(s) Performed: * No procedures listed *  Patient location during evaluation: Women's Unit Anesthesia Type: Epidural Level of consciousness: awake and alert and oriented Pain management: satisfactory to patient Vital Signs Assessment: post-procedure vital signs reviewed and stable Respiratory status: respiratory function stable Cardiovascular status: stable Postop Assessment: no headache and no backache Anesthetic complications: no    Last Vitals:  Filed Vitals:   03/17/15 0038 03/17/15 0405  BP: 104/54 101/60  Pulse: 79 72  Temp: 36.9 C 36.8 C  Resp: 18 20    Last Pain:  Filed Vitals:   03/17/15 0418  PainSc: 2                  [name]

## 2015-03-17 NOTE — Anesthesia Post-op Follow-up Note (Signed)
  Anesthesia Pain Follow-up Note  Patient: Laura Woods  Day #: 1  Date of Follow-up: 03/17/2015 Time: 8:16 AM  Last Vitals:  Filed Vitals:   03/17/15 0038 03/17/15 0405  BP: 104/54 101/60  Pulse: 79 72  Temp: 36.9 C 36.8 C  Resp: 18 20    Level of Consciousness: alert  Pain: mild   Side Effects:None  Catheter Site Exam: site not evaluated  Plan: D/C from anesthesia care  [name]

## 2015-03-18 MED ORDER — FERROUS SULFATE 325 (65 FE) MG PO TABS: 325.0000 mg | ORAL_TABLET | Freq: Two times a day (BID) | ORAL | Status: DC

## 2015-03-18 MED ORDER — IBUPROFEN 800 MG PO TABS: 800.0000 mg | ORAL_TABLET | Freq: Three times a day (TID) | ORAL | Status: DC | PRN

## 2015-03-18 MED ORDER — DOCUSATE SODIUM 100 MG PO CAPS: 100.0000 mg | ORAL_CAPSULE | Freq: Two times a day (BID) | ORAL | Status: DC | PRN

## 2015-03-18 NOTE — Progress Notes (Signed)
Patient understands all discharge instructions and the need to make follow up appointments. Patient discharge via wheelchair with auxillary. 

## 2015-03-18 NOTE — Discharge Summary (Signed)
Obstetric Discharge Summary Reason for Admission: onset of labor Prenatal Procedures: ultrasound Intrapartum Procedures: spontaneous vaginal delivery (VBAC) Postpartum Procedures: none Complications-Operative and Postpartum: 2nd degree perineal laceration and 1st degree vaginal laceration  CBC Latest Ref Rng 03/17/2015 03/16/2015 12/17/2014  WBC 3.6 - 11.0 K/uL 11.2(H) 10.0 -  Hemoglobin 12.0 - 16.0 g/dL 7.2(Z) 36.6 -  Hematocrit 35.0 - 47.0 % 28.9(L) 37.5 31.7(L)  Platelets 150 - 440 K/uL 126(L) 152 -     Physical Exam:  General: alert and no distress Lochia: appropriate Uterine Fundus: firm Incision: None DVT Evaluation: No evidence of DVT seen on physical exam. Negative Homan's sign. No cords or calf tenderness. No significant calf/ankle edema.  Discharge Diagnoses: Term Pregnancy-delivered  Discharge Information: Date: 03/18/2015 Activity: pelvic rest Diet: routine Medications: PNV, Ibuprofen, Colace and Iron Condition: stable Instructions: refer to practice specific booklet Discharge to: home   Newborn Data: Live born female  Birth Weight: 7 lb 3.3 oz (3270 g) APGAR: 8, 9  Home with mother.  Hildred Laser 03/18/2015, 10:26 AM

## 2015-03-18 NOTE — Progress Notes (Signed)
Post Partum Day #2 s/p SVD  Subjective: up ad lib, voiding and tolerating PO  Objective: Blood pressure 106/56, pulse 80, temperature 98.4 F (36.9 C), temperature source Oral, resp. rate 20, height  (1.6 m), weight 159 lb (72.122 kg), last menstrual period 06/25/2014, SpO2 100 %, unknown if currently breastfeeding.  Physical Exam:  General: alert and no distress  CVS: S1 and S2 present.  Regular rate and rhythm. Pulm: clear to auscultation bilaterally Lochia: appropriate Uterine Fundus: firm Incision: None DVT Evaluation: No evidence of DVT seen on physical exam. Negative Homan's sign. No cords or calf tenderness. No significant calf/ankle edema.   Recent Labs  03/16/15 0054 03/17/15 0617  HGB 12.8 9.6*  HCT 37.5 28.9*    Assessment/Plan: Discharge home, Breastfeeding, Circumcision prior to discharge and Contraception is planned vasectomy. Anemia - mild, due to delivery blood loss. Mildly symptomatic, dizziness with ambulation on yesterday, improved today. Will treat with PO iron supplements.    LOS: 2 days   Hildred Laser 03/18/2015, 10:25 AM

## 2015-03-18 NOTE — Discharge Instructions (Signed)
Parto vaginal, Cuidados posteriores  °(Vaginal Delivery, Care After) °Siga estas instrucciones durante las próximas semanas. Estas indicaciones para el alta le proporcionan información general acerca de cómo deberá cuidarse después del parto. El médico también podrá darle instrucciones específicas. El tratamiento ha sido planificado según las prácticas médicas actuales, pero en algunos casos pueden ocurrir problemas. Comuníquese con el médico si tiene algún problema o tiene preguntas al volver a su casa.  °INSTRUCCIONES PARA EL CUIDADO EN EL HOGAR  °· Tome sólo medicamentos de venta libre o recetados, según las indicaciones del médico o del farmacéutico. °· No beba alcohol, especialmente si está amamantando o toma analgésicos. °· No mastique tabaco ni fume. °· No consuma drogas. °· Continúe con un adecuado cuidado perineal. El buen cuidado perineal incluye: °¨ Higienizarse de adelante hacia atrás. °¨ Mantener la zona perineal limpia. °· No use tampones ni duchas vaginales hasta que su médico la autorice. °· Dúchese, lávese el cabello y tome baños de inmersión según las indicaciones de su médico. °· Utilice un sostén que le ajuste bien y que brinde buen soporte a sus mamas. °· Consuma alimentos saludables. °· Beba suficiente líquido para mantener la orina clara o de color amarillo pálido. °· Consuma alimentos ricos en fibra como cereales y panes integrales, arroz, frijoles y frutas y verduras frescas todos los días. Estos alimentos pueden ayudarla a prevenir o aliviar el estreñimiento. °· Siga las recomendaciones de su médico relacionadas con la reanudación de actividades como subir escaleras, conducir automóviles, levantar objetos, hacer ejercicios o viajar. °· Hable con su médico acerca de reanudar la actividad sexual. Volver a la actividad sexual depende del riesgo de infección, la velocidad de la curación y la comodidad y su deseo de reanudarla. °· Trate de que alguien la ayude con las actividades del hogar y con  el recién nacido al menos durante un par de días después de salir del hospital. °· Descanse todo lo que pueda. Trate de descansar o tomar una siesta mientras el bebé está durmiendo. °· Aumente sus actividades gradualmente. °· Cumpla con todas las visitas de control programadas para después del parto. Es muy importante asistir a todas las citas programadas de seguimiento. En estas citas, su médico va a controlarla para asegurarse de que esté sanando física y emocionalmente. °SOLICITE ATENCIÓN MÉDICA SI:  °· Elimina coágulos grandes por la vagina. Guarde algunos coágulos para mostrarle al médico. °· Tiene una secreción con feo olor que proviene de la vagina. °· Tiene dificultad para orinar. °· Orina con frecuencia. °· Siente dolor al orinar. °· Nota un cambio en sus movimientos intestinales. °· Aumenta el enrojecimiento, el dolor o la hinchazón en la zona de la incisión vaginal (episiotomía) o el desgarro vaginal. °· Tiene pus que drena por la episiotomía o el desgarro vaginal. °· La episiotomía o el desgarro vaginal se abren. °· Sus mamas le duelen, están duras o enrojecidas. °· Sufre un dolor intenso de cabeza. °· Tiene visión borrosa o ve manchas. °· Se siente triste o deprimida. °· Tiene pensamientos acerca de lastimarse o dañar al recién nacido. °· Tiene preguntas acerca de su cuidado personal, el cuidado del recién nacido o acerca de los medicamentos. °· Se siente mareada o sufre un desmayo. °· Tiene una erupción. °· Tiene náuseas o vómitos. °· Usted amamantó al bebé y no ha tenido su período menstrual dentro de las 12 semanas después de dejar de amamantar. °· No amamanta al bebé y no tuvo su período menstrual en las últimas 12° semanas después del   parto. °· Tiene fiebre. °SOLICITE ATENCIÓN MÉDICA DE INMEDIATO SI:  °· Siente dolor persistente. °· Siente dolor en el pecho. °· Le falta el aire. °· Se desmaya. °· Siente dolor en la pierna. °· Siente dolor en el estómago. °· El sangrado vaginal satura dos o más  apósitos en 1 hora. °  °Esta información no tiene como fin reemplazar el consejo del médico. Asegúrese de hacerle al médico cualquier pregunta que tenga. °  °Document Released: 01/22/2005 Document Revised: 10/13/2014 °Elsevier Interactive Patient Education ©2016 Elsevier Inc. ° °

## 2015-03-29 ENCOUNTER — Encounter: Payer: Self-pay | Admitting: Obstetrics and Gynecology

## 2015-03-29 ENCOUNTER — Ambulatory Visit (INDEPENDENT_AMBULATORY_CARE_PROVIDER_SITE_OTHER): Payer: Managed Care, Other (non HMO) | Admitting: Obstetrics and Gynecology

## 2015-03-29 VITALS — BP 111/67 | HR 94 | Ht 63.0 in | Wt 140.6 lb

## 2015-03-29 DIAGNOSIS — R102 Pelvic and perineal pain: Secondary | ICD-10-CM

## 2015-03-29 DIAGNOSIS — O8612 Endometritis following delivery: Secondary | ICD-10-CM

## 2015-03-29 LAB — POCT URINALYSIS DIPSTICK
Bilirubin, UA: NEGATIVE
GLUCOSE UA: NEGATIVE
KETONES UA: NEGATIVE
Nitrite, UA: NEGATIVE
Protein, UA: NEGATIVE
SPEC GRAV UA: 1.01
UROBILINOGEN UA: NEGATIVE
pH, UA: 6.5

## 2015-03-29 MED ORDER — AMOXICILLIN-POT CLAVULANATE 875-125 MG PO TABS: 1.0000 | ORAL_TABLET | Freq: Two times a day (BID) | ORAL | Status: DC

## 2015-03-29 NOTE — Progress Notes (Signed)
   Subjective:   Laura Woods is a 30 y.o. [phone] female who is 2 weeks postpartum.  She presents with complaints of chills and fevers, and middle abdominal pain (in region of umbilicus) over the past 4 days. Denies cough, recent sick contacts.  Denies breast tenderness, erythema. Denies dysuria. Denies foul smelling vaginal discharge, but does note mild odor to blood. Denies nausea/vomiting.    The following portions of the patient's history were reviewed and updated as appropriate: allergies, current medications, past family history, past medical history, past social history, past surgical history and problem list.     ROS:  Review of Systems - Negative except what is noted in HPI.    Objective:  BP 111/67 mmHg  Pulse 94  Ht  (1.6 m)  Wt 140 lb 9.6 oz (63.776 kg)  BMI 24.91 kg/m2  Breastfeeding? Yes  Gen App:   Alert, no acute distress  HEENT: NCAT, no nasal drainage, eye drainage, throat clear  Cardiovascular:    regular rate and rhythm, S1, S2 normal, no murmur, click, rub or gallop  Respiratory:  clear to auscultation bilaterally  Breasts:  normal appearance, no masses or tenderness or engorgement  Abdomen:  soft ,mild fundal tenderness  Vaginal:  Discharge, bloody, scant odor. No lesions  Extremities: Nontender, no edema or erythema    Results for orders placed or performed in visit on 03/29/15 (from the past 24 hour(s))  POCT urinalysis dipstick     Status: Abnormal   Collection Time: 03/29/15  2:43 PM  Result Value Ref Range   Color, UA Dark yellow    Clarity, UA clear    Glucose, UA neg    Bilirubin, UA neg    Ketones, UA neg    Spec Grav, UA 1.010    Blood, UA Large    pH, UA 6.5    Protein, UA neg    Urobilinogen, UA negative    Nitrite, UA neg    Leukocytes, UA moderate (2+) (A) Negative    Assessment and Plan:    1. Pelvic pain in female - Mild uterine tenderness noted, concerning for postpartum endometritis.  Afebrile today, patient  able to tolerate PO. Will treat outpatient with Augmentin x 7 days.   - Assessed for UTI:  POCT urinalysis dipstick negative - Can continue with Ibuprofen as it helps with pain.  - Return to clinic if symptoms worsen. Otherwise can f/u in 4 weeks for postpartum exam.      Hildred Laser, MD

## 2015-03-31 LAB — URINE CULTURE

## 2015-04-01 ENCOUNTER — Telehealth: Payer: Self-pay

## 2015-04-01 LAB — NUSWAB VAGINITIS PLUS (VG+)
CANDIDA GLABRATA, NAA: NEGATIVE
Candida albicans, NAA: NEGATIVE
Chlamydia trachomatis, NAA: NEGATIVE
Neisseria gonorrhoeae, NAA: NEGATIVE
TRICH VAG BY NAA: NEGATIVE

## 2015-04-01 NOTE — Telephone Encounter (Signed)
Called pt informed of UTI and the need to continue antibiotic. Pt gave verbal understanding.

## 2015-04-01 NOTE — Telephone Encounter (Signed)
-----   Message from Hildred Laser, MD sent at 03/31/2015  5:02 PM EST ----- Patient has a UTI.  Please inform.  Is already taking Augmentin.

## 2015-04-06 ENCOUNTER — Encounter: Payer: PRIVATE HEALTH INSURANCE | Admitting: Obstetrics and Gynecology

## 2015-04-12 ENCOUNTER — Telehealth: Payer: Self-pay | Admitting: Obstetrics and Gynecology

## 2015-04-12 DIAGNOSIS — R42 Dizziness and giddiness: Secondary | ICD-10-CM

## 2015-04-12 NOTE — Telephone Encounter (Signed)
Called pt (with interpreter services) pt states that she has been having episodes of dizziness and weakness. Pt states that she has been eating well, as well as staying hydrated. Pt denies headache but does note ringing like noise in her ear especially at the time of her dizzy episodes. Pt also notes that she is concerned about her bleeding. Pt states that she has had several weeks of bleeding that stopped suddenly and has now resumed. Advised pt that postpartum bleeding can last upwards of 6wks, as long as bleeding is not heavy (i.e. Soaking through a pad or tampon every ) that bleeding is normal. ENT referral was placed as urgent.

## 2015-04-12 NOTE — Telephone Encounter (Signed)
PT SAYS SHE STILL FEELS REAALY BAD. HER BABY IS 411 MONTH OLD AND SHE STOPPED BLEEDING 2 DAYS AGO AND STARTED BACK. SHE ASKS FOR YOU TO DO O CONFERENCE CALL WITH INTERPERTER. THANKS!!!

## 2015-04-27 ENCOUNTER — Ambulatory Visit (INDEPENDENT_AMBULATORY_CARE_PROVIDER_SITE_OTHER): Payer: Managed Care, Other (non HMO) | Admitting: Obstetrics and Gynecology

## 2015-04-27 ENCOUNTER — Encounter: Payer: Self-pay | Admitting: Obstetrics and Gynecology

## 2015-04-27 DIAGNOSIS — O9081 Anemia of the puerperium: Secondary | ICD-10-CM

## 2015-04-27 DIAGNOSIS — O34219 Maternal care for unspecified type scar from previous cesarean delivery: Secondary | ICD-10-CM

## 2015-04-27 DIAGNOSIS — N61 Mastitis without abscess: Secondary | ICD-10-CM

## 2015-04-27 MED ORDER — HYDROCORTISONE 0.5 % EX CREA: 1.0000 "application " | TOPICAL_CREAM | Freq: Two times a day (BID) | CUTANEOUS | Status: DC

## 2015-04-27 MED ORDER — DICLOXACILLIN SODIUM 250 MG PO CAPS: 500.0000 mg | ORAL_CAPSULE | Freq: Four times a day (QID) | ORAL | Status: DC

## 2015-04-27 NOTE — Progress Notes (Signed)
   OBSTETRICS POSTPARTUM CLINIC PROGRESS NOTE  Subjective:     Laura Woods is a 30 y.o. [phone] female who presents for a postpartum visit. She is 6 weeks postpartum following a spontaneous vaginal delivery (VBAC). I have fully reviewed the prenatal and intrapartum course. The delivery was at 40 gestational weeks.  Anesthesia: epidural. Postpartum course has been well. Baby's course has been well. Baby is feeding by breast and bottle. Bleeding: patient has not resumed menses. Bowel function is normal. Bladder function is normal. Patient is not sexually active. Contraception method desired is vasectomy (will use condoms during interim). Postpartum depression screening: negative.  The following portions of the patient's history were reviewed and updated as appropriate: allergies, current medications, past family history, past medical history, past social history, past surgical history and problem list.  Review of Systems A comprehensive review of systems was negative except for: Genitourinary: positive for pelvic cramping Integument/breast: positive for breast tenderness and rash   Objective:    BP 113/66 mmHg  Pulse 83  Ht 5\' 3"  (1.6 m)  Wt 133 lb 3.2 oz (60.419 kg)  BMI 23.60 kg/m2  Breastfeeding? Yes  General:  alert and no distress  Skin:   Skin: mobility and turgor normal and no evidence of bleeding or bruising.  Macular rash - arm(s) left.     Breasts:  inspection with tenderness of left breast, scant streaking noted. No nipple discharge or bleeding, no masses or nodularity palpable  Lungs: clear to auscultation bilaterally  Heart:  regular rate and rhythm, S1, S2 normal, no murmur, click, rub or gallop  Abdomen: soft, non-tender; bowel sounds normal; no masses,  no organomegaly.     Vulva:  normal  Vagina: normal vagina, no discharge, exudate, lesion, or erythema  Cervix:  no cervical motion tenderness and no lesions  Corpus: normal size, contour, position, consistency,  mobility, non-tender  Adnexa:  normal adnexa and no mass, fullness, tenderness  Rectal Exam: Not performed.         Labs:  Lab Results  Component Value Date   HGB 9.6* 03/17/2015    Assessment:   Routine postpartum exam (s/p VBAC) Postpartum mastitis Postpartum anemia   Plan:   1. Contraception: vasectomy with condom use during interim. 2. Will check Hgb for h/o anemia.  3. Prescribed Dicloxacillin for postpartum mastitis 4. Can OTC hydrocortisone cream for macular rash on left arm.  Follow up in: 3-6 months for annual exam, or as needed.    Hildred LaserAnika Tyee Vandevoorde, MD [facility]

## 2015-04-28 LAB — HEMOGLOBIN AND HEMATOCRIT, BLOOD
Hematocrit: 34.5 % (ref 34.0–46.6)
Hemoglobin: 11.7 g/dL (ref 11.1–15.9)

## 2015-05-02 ENCOUNTER — Telehealth: Payer: Self-pay

## 2015-05-02 NOTE — Telephone Encounter (Signed)
-----   Message from Hildred LaserAnika Cherry, MD sent at 04/28/2015  8:29 PM EDT ----- Please inform of normal hemoglobin.  Can discontinue iron tablets.

## 2015-05-02 NOTE — Telephone Encounter (Signed)
Called pt no answer, unable to LM as voicemail is full.

## 2015-05-03 NOTE — Telephone Encounter (Signed)
Called pt informed her of information below, pt gave verbal understanding.  

## 2015-10-26 ENCOUNTER — Ambulatory Visit (INDEPENDENT_AMBULATORY_CARE_PROVIDER_SITE_OTHER): Payer: Managed Care, Other (non HMO) | Admitting: Obstetrics and Gynecology

## 2015-10-26 ENCOUNTER — Encounter: Payer: Self-pay | Admitting: Obstetrics and Gynecology

## 2015-10-26 VITALS — BP 106/64 | HR 89 | Ht 63.0 in | Wt 131.4 lb

## 2015-10-26 DIAGNOSIS — Z01419 Encounter for gynecological examination (general) (routine) without abnormal findings: Secondary | ICD-10-CM | POA: Diagnosis not present

## 2015-10-26 DIAGNOSIS — D696 Thrombocytopenia, unspecified: Secondary | ICD-10-CM | POA: Diagnosis not present

## 2015-10-26 DIAGNOSIS — K625 Hemorrhage of anus and rectum: Secondary | ICD-10-CM

## 2015-10-26 DIAGNOSIS — R21 Rash and other nonspecific skin eruption: Secondary | ICD-10-CM | POA: Diagnosis not present

## 2015-10-26 DIAGNOSIS — K648 Other hemorrhoids: Secondary | ICD-10-CM | POA: Diagnosis not present

## 2015-10-26 DIAGNOSIS — Z3009 Encounter for other general counseling and advice on contraception: Secondary | ICD-10-CM

## 2015-10-26 DIAGNOSIS — G4489 Other headache syndrome: Secondary | ICD-10-CM | POA: Diagnosis not present

## 2015-10-26 MED ORDER — HYDROCORTISONE ACETATE 25 MG RE SUPP: 25.0000 mg | Freq: Two times a day (BID) | RECTAL | 0 refills | Status: DC

## 2015-10-26 MED ORDER — HYDROCORTISONE 0.5 % EX CREA: 1.0000 "application " | TOPICAL_CREAM | Freq: Two times a day (BID) | CUTANEOUS | 0 refills | Status: DC

## 2015-10-26 NOTE — Patient Instructions (Addendum)
Hemorroides  (Hemorrhoids) Las hemorroides son venas inflamadas alrededor del recto o del ano. Hay dos tipos de hemorroides:   Hemorroides internas. Se forman en las venas del interior del recto. Pueden abultarse hacia el exterior e irritarse y Secretary/administrator.  Hemorroides externas. Se producen en las venas externas al ano y pueden sentirse como un bulto o zona hinchada dura y dolorosa cerca del ano. CAUSAS   Embarazo.   Obesidad.   Constipacin o diarrea.   Dificultad para mover el intestino.   Permanecer sentado durante largos perodos en el inodoro.  Levantar objetos pesados u otras actividades que impliquen esfuerzo.  Sexo anal. SNTOMAS   Dolor.   Picazn o irritacin anal.   Sangrado rectal.   Prdida fecal.   Inflamacin anal.   Uno o ms bultos en la zona anal.  DIAGNSTICO  El mdico puede diagnosticar las hemorroides mediante un examen visual. Otros estudios o anlisis que se pueden realizar son:   Examen de la zona rectal con Ardelia Mems mano enguantada (examen digital rectal).   Examen del canal anal utilizando un pequeo tubo (endoscopio).   Anlisis de sangre si ha perdido [place] cantidad significativa de [place].  Un estudio para observar el interior del colon (sigmoideoscopa o colonoscopa). TRATAMIENTO  La mayora de las hemorroides pueden tratarse en casa. Sin embargo, si los sntomas no mejoran o tiene Nurse, children's, el mdico puede Optometrist un procedimiento para disminuir las hemorroides o extirparlas completamente. Los tratamientos posibles son:   Colocacin de una banda de goma en la base de la hemorroide para cortar la circulacin (ligadura con Forensic psychologist).   Inyeccin de una sustancia qumica para Neurosurgeon las hemorroides (escleroterapia).   Utilizacin de un instrumento para quemar las hemorroides (terapia con luz infrarroja).   Extirpacin quirrgica de la hemorroides (hemorroidectoma).   Colocacin de grapas en la hemorroides para  bloquear el flujo de sangre a los tejidos (engrapado de hemorroides).  INSTRUCCIONES PARA EL CUIDADO EN EL HOGAR   Consuma alimentos con fibra, como cereales integrales, legumbres, frutos secos, frutas y verduras. Pregntele a su mdico acerca de tomar productos con fibra aadida en ellos (suplementos defibra).  Aumente la ingestin de lquidos. Beba gran cantidad de lquido para mantener la orina de tono claro o color amarillo plido.   Haga ejercicios regularmente.   Vaya al bao cuando sienta la necesidad de mover el intestino. No espere.   Evite hacer fuerza al mover el intestino.   Mantenga la zona anal limpia y seca. Use papel higinico hmedo o toallitas humedecidas despus de mover el intestino.   Puede usar o Midwife segn las indicaciones algunas cremas especiales y supositorios.   Tome slo medicamentos de venta libre o recetados, segn las indicaciones del mdico.   Tome baos de asiento tibios durante 15-20 minutos, 3-4 veces por da para Glass blower/designer y las [place].   Coloque una bolsa de hielo sobre las hemorroides si le duelen o se hinchan. Usar compresas de [employer]urant baos de asiento puede ser [place].   Ponga el hielo en una bolsa plstica.   Colquese una toalla entre la piel y la bolsa de hielo.   Deje el hielo durante 15 - 20 minutos y aplquelo 3 - 4 veces por Training and development officer.   No utilice una almohada en forma de aro ni se siente en el inodoro durante perodos prolongados. Esto aumenta la afluencia de sangre y Conservation officer, historic buildings.  SOLICITE ATENCIN MDICA SI:   Aumenta el dolor y la hinchazn y no  puede controlarlo con la medicacin o con un tratamiento.  Tiene un sangrado que no Magazine features editorpuede controlar.  No puede mover el intestino.  Siente dolor o tiene inflamacin fuera de la zona de las hemorroides. ASEGRESE DE QUE:   Comprende estas instrucciones.  Controlar su enfermedad.  Recibir ayuda de inmediato si no mejora o si empeora.   Esta  informacin no tiene Theme park managercomo fin reemplazar el consejo del mdico. Asegrese de hacerle al mdico cualquier pregunta que tenga.   Document Released: 01/22/2005 Document Revised: 09/24/2012 Elsevier Interactive Patient Education 2016 [employer]      Dieta rica en fibra (High-Fiber Diet) Minta BalsamLa fibra, tambin llamada fibra dietaria, es un tipo de carbohidrato que se encuentra en las frutas, las verduras, los cereales integrales y los frijoles. Una dieta rica en fibra puede tener muchos beneficios para la salud. El mdico puede recomendar una dieta rica en fibra para ayudar a:  Chief Strategy Officervitar el estreimiento. La fibra puede hacer que defeque con ms frecuencia.  Disminuir el nivel de colesterol.  Aliviar las hemorroides, la diverticulosis no complicada o el sndrome del intestino irritable.  Evitar comer en exceso como parte de un plan para bajar de peso.  Evitar cardiopatas, la diabetes tipo 2 y ciertos cnceres. EN QU CONSISTE EL PLAN? El consumo diario recomendado de fibra incluye lo siguiente:  38gramos para hombres menores de 50 aos.  30gramos para hombres mayores de [place]50 aos.  25gramos para mujeres menores de 50 aos.  21gramos para mujeres mayores de [place]50 aos. Puede lograr el consumo diario recomendado de fibra si come una variedad de frutas, verduras, cereales y frijoles. El mdico tambin puede recomendar un suplemento de fibra si no es posible obtener suficiente fibra a travs de la dieta. QU DEBO SABER ACERCA DE LA DIETA RICA EN FIBRA?  La eficacia de los suplementos de Lumbertonfibra no ha sido estudiada [place] Gardensampliamente, de modo que es mejor obtener fibra a travs de los alimentos.  Verifique siempre el contenido de fibra en la etiqueta de informacin nutricional de los alimentos preenvasados. Busque alimentos que contengan al menos 5gramos de fibra por porcin.  Consulte al nutricionista si tiene preguntas sobre algunos alimentos especficos relacionados con su enfermedad,  especialmente si estos alimentos no se mencionan a continuacin.  Aumente el consumo diario de fibra en forma gradual. Aumentar demasiado rpido el consumo de fibra dietaria puede provocar meteorismo, clicos o gases.  Beber abundante agua. El [place] ayuda a Geophysicist/field seismologistdigerir la fibra. QU ALIMENTOS PUEDO COMER? Cereales Panes integrales. Multicereales. Avena. Arroz integral. Gypsy Decantebada. Trigo burgol. Mijo. Muffins de salvado. Palomitas de maz. Galletas de centeno. Verduras Batatas. Espinaca. Col rizada. Alcachofas. Repollo. Brcoli. Guisantes. Zanahorias. [place]. Frutas Frutos rojos. Peras. Manzanas. Naranjas Aguacates. Ciruelas y pasas. Higos secos. Carnes y otras fuentes de protenas Frijoles blancos, colorados, pintos y porotos de soja. Guisantes secos. Lentejas. Frutos secos y semillas. Lcteos Yogur fortificado con Research scientist (life sciences)fibra. Bebidas Leche de soja fortificada con Bjorn Loserfibra. Jugo de naranja fortificado con Bjorn Loserfibra. Otros Barras de Riversidefibra. Los artculos mencionados arriba pueden no ser Raytheonuna lista completa de las bebidas o los alimentos recomendados. Comunquese con el nutricionista para conocer ms opciones. QU ALIMENTOS NO SE RECOMIENDAN? Cereales Pan blanco. Pastas hechas con Webb Lawsharina refinada. Arroz blanco. Verduras Papas fritas. Verduras enlatadas. Verduras bien cocidas.  Frutas Jugo de frutas. Frutas cocidas coladas. Carnes y [address]otras fuentes de protenas Cortes de carne con Holiday representativegrasa. Aves o pescados fritos. Lcteos Leche. Yogur. Queso crema. PPG IndustriesCrema cida. Bebidas Gaseosas. Otros Tortas y pasteles. Mantequilla y aceites. Los  artculos mencionados arriba pueden no ser Raytheon de las bebidas y los alimentos que se Theatre stage manager. Comunquese con el nutricionista para obtener ms informacin. ALGUNOS CONSEJOS PARA INCLUIR ALIMENTOS RICOS EN FIBRA EN LA DIETA  Consuma una gran variedad de alimentos ricos en fibra.  Asegrese de que la mitad de todos los cereales consumidos cada da sean  cereales integrales.  Reemplace los panes y cereales hechos de harina refinada o harina blanca por panes y cereales integrales.  Reemplace el arroz blanco por arroz integral, trigo burgol o mijo.  Comience Medical laboratory scientific officer con un desayuno rico en [place], como un cereal que contenga al menos 5gramos de fibra por porcin.  Use guisantes en lugar de carne en las sopas, ensaladas o pastas.  Coma bocadillos ricos en fibra, como frutos rojos, verduras crudas, frutos secos o palomitas de maz.   Esta informacin no tiene Theme park manager el consejo del mdico. Asegrese de hacerle al mdico cualquier pregunta que tenga.   Document Released: 01/22/2005 Document Revised: 02/12/2014 Elsevier Interactive Patient Education [employer]

## 2015-10-26 NOTE — Progress Notes (Signed)
GYNECOLOGY ANNUAL PHYSICAL EXAM PROGRESS NOTE  Subjective:    Laura Woods is a 30 y.o. G66P2002 female who presents for an annual exam.  The patient is sexually active. The patient wears seatbelts: yes. The patient participates in regular exercise: no. Has the patient ever been transfused or tattooed?: no. The patient reports that there is not domestic violence in her life.    The patient has the following complaints today:  1. Bloody stools x 2 months.  Notes small amount of bright red blood with bowel movements.  Denies constipation.  2. Headaches - starts at top of head, moves to jaws bilaterally.  Sometimes aggravated by lights.  Worsened when eating sweets. Has not also had an eye exam in several years.   Takes Tylenol and Ibuprofen with minimal relief.  3. Desires to discuss contraception management.    Gynecologic History No LMP recorded. Patient is not currently having periods (Reason: Lactating).  Contraception: condoms History of STI's: Denies Last Pap: 02/2013. Results were: normal.  Denies h/o abnormal pap smears.    Obstetric History   G2   P2   T2   P0   A0   L2    SAB0   TAB0   Ectopic0   Multiple0   Live Births2     # Outcome Date GA Lbr Len/2nd Weight Sex Delivery Anes PTL Lv  2 Term 03/16/15 [redacted]w[redacted]d 06:08 / 03:07 7 lb 3.3 oz (3.27 kg) M Vag-Spont EPI  LIV     Name: MORA SALAZAR,PENDINGBABY     Apgar1:  8                Apgar5: 9  1 Term 09/29/09 [redacted]w[redacted]d  7 lb (3.175 kg) M CS-LTranv   LIV     Complications: Failure to Progress in Second Stage    Obstetric Comments  Asynclitism of fetal head    Past Medical History:  Diagnosis Date  . Anemia   . Constipation     Past Surgical History:  Procedure Laterality Date  . CESAREAN SECTION    . COLON SURGERY      Family History  Problem Relation Age of Onset  . Healthy Mother   . Healthy Father     Social History   Social History  . Marital status: Single    Spouse name: N/A  . Number of  children: N/A  . Years of education: N/A   Occupational History  . Not on file.   Social History Main Topics  . Smoking status: Never Smoker  . Smokeless tobacco: Never Used  . Alcohol use No  . Drug use: No  . Sexual activity: Yes    Birth control/ protection: Condom   Other Topics Concern  . Not on file   Social History Narrative  . No narrative on file    No current outpatient prescriptions on file prior to visit.   No current facility-administered medications on file prior to visit.     No Known Allergies    Review of Systems Constitutional: negative for chills, fatigue, fevers and sweats Eyes: negative for irritation, redness and visual disturbance Ears, nose, mouth, throat, and face: negative for hearing loss, nasal congestion, snoring and tinnitus Respiratory: negative for asthma, cough, sputum Cardiovascular: negative for chest pain, dyspnea, exertional chest pressure/discomfort, irregular heart beat, palpitations and syncope Gastrointestinal: positive for bloody stools (bright red blood).  Negative for abdominal pain, change in bowel habits, nausea and vomiting Genitourinary: negative for abnormal menstrual  periods, genital lesions, sexual problems and vaginal discharge, dysuria and urinary incontinence Integument/breast: positive for skin rash (macular on bilateral arms, denies recent exposures).  Negative for breast lump, breast tenderness and nipple discharge Hematologic/lymphatic: negative for bleeding and easy bruising Musculoskeletal:negative for back pain and muscle weakness Neurological: negative for dizziness, headaches, vertigo and weakness Endocrine: negative for diabetic symptoms including polydipsia, polyuria and skin dryness Allergic/Immunologic: negative for hay fever and urticaria       Objective:  Blood pressure 106/64, pulse 89, height 5\' 3"  (1.6 m), weight 131 lb 6.4 oz (59.6 kg), currently breastfeeding. Body mass index is 23.28  kg/m.   General Appearance:    Alert, cooperative, no distress, appears stated age  Head:    Normocephalic, without obvious abnormality, atraumatic  Eyes:    PERRL, conjunctiva/corneas clear, EOM's intact, both eyes  Ears:    Normal external ear canals, both ears  Nose:   Nares normal, septum midline, mucosa normal, no drainage or sinus tenderness  Throat:   Lips, mucosa, and tongue normal; teeth and gums normal  Neck:   Supple, symmetrical, trachea midline, no adenopathy; thyroid: no enlargement/tenderness/nodules; no carotid bruit or JVD  Back:     Symmetric, no curvature, ROM normal, no CVA tenderness  Lungs:     Clear to auscultation bilaterally, respirations unlabored  Chest Wall:    No tenderness or deformity   Heart:    Regular rate and rhythm, S1 and S2 normal, no murmur, rub or gallop  Breast Exam:    No tenderness, masses, or nipple abnormality  Abdomen:     Soft, non-tender, bowel sounds active all four quadrants, no masses, no organomegaly.    Genitalia:    Pelvic:external genitalia normal, vagina without lesions, discharge, or tenderness, rectovaginal septum  normal. Cervix normal in appearance, no cervical motion tenderness, no adnexal masses or tenderness.  Uterus normal size, shape, mobile, regular contours, nontender.  Rectal:    Normal external sphincter.  No external hemorrhoids appreciated. Internal exam with possible internal hemorrhoid palpable.    Extremities:   Extremities normal, atraumatic, no cyanosis or edema  Pulses:   2+ and symmetric all extremities  Skin:   Skin color, texture, turgor normal.  Scattered areas of mild macular lesions on arms, no erythema present.   Lymph nodes:   Cervical, supraclavicular, and axillary nodes normal  Neurologic:   CNII-XII intact, normal strength, sensation and reflexes throughout   .  Labs:  Lab Results  Component Value Date   WBC 11.2 (H) 03/17/2015   HGB 9.6 (L) 03/17/2015   HCT 34.5 04/27/2015   MCV 88.4 03/17/2015    PLT 126 (L) 03/17/2015    No results found for: CREATININE, BUN, NA, K, CL, CO2  No results found for: ALT, AST, GGT, ALKPHOS, BILITOT  No results found for: TSH   Assessment:   Routine gynecologic exam Rectal bleeding Possible internal hemorrhoid Headaches Skin rash Gestational thrombocytopenia Contraception management     Plan:     Blood tests: CBC with diff and Comprehensive metabolic panel. Breast self exam technique reviewed and patient encouraged to perform self-exam monthly. Contraception: Discussed contraception options with patient.  Desires Paraguard IUD.  Will schedule appointment for insertion in next several weeks. Discussed healthy lifestyle modifications. Pap smear up to date, next one due in 2018.   Anusol prescribed for hemorrhoid, also discussed increasing fiber and water intake, sitz baths. If no resolution, can send to GI for further evaluation.  Headaches that are unrelieved with  OTC headaches.  Advised patient to limit sweet intake, can take Excedrin migraine for headaches. Encouraged to have eye exam.  Prescribed hydrocortisone cream for rash.    [facility] Spanish  interpreter used for today's visit.    Hildred Laser, MD [facility]

## 2015-10-27 LAB — CBC
HEMATOCRIT: 36 % (ref 34.0–46.6)
Hemoglobin: 12.4 g/dL (ref 11.1–15.9)
MCH: 29 pg (ref 26.6–33.0)
MCHC: 34.4 g/dL (ref 31.5–35.7)
MCV: 84 fL (ref 79–97)
PLATELETS: 252 10*3/uL (ref 150–379)
RBC: 4.27 x10E6/uL (ref 3.77–5.28)
RDW: 13 % (ref 12.3–15.4)
WBC: 6.5 10*3/uL (ref 3.4–10.8)

## 2015-10-27 LAB — COMPREHENSIVE METABOLIC PANEL
A/G RATIO: 1.8 (ref 1.2–2.2)
ALK PHOS: 73 IU/L (ref 39–117)
ALT: 14 IU/L (ref 0–32)
AST: 15 IU/L (ref 0–40)
Albumin: 4.4 g/dL (ref 3.5–5.5)
BUN/Creatinine Ratio: 20 (ref 9–23)
BUN: 11 mg/dL (ref 6–20)
Bilirubin Total: 0.4 mg/dL (ref 0.0–1.2)
CO2: 26 mmol/L (ref 18–29)
Calcium: 9.4 mg/dL (ref 8.7–10.2)
Chloride: 99 mmol/L (ref 96–106)
Creatinine, Ser: 0.54 mg/dL — ABNORMAL LOW (ref 0.57–1.00)
GFR calc Af Amer: 146 mL/min/{1.73_m2} (ref >59)
GFR calc non Af Amer: 127 mL/min/{1.73_m2} (ref >59)
GLOBULIN, TOTAL: 2.4 g/dL (ref 1.5–4.5)
Glucose: 36 mg/dL — CL (ref 65–99)
POTASSIUM: 3.9 mmol/L (ref 3.5–5.2)
SODIUM: 142 mmol/L (ref 134–144)
Total Protein: 6.8 g/dL (ref 6.0–8.5)

## 2015-10-28 ENCOUNTER — Telehealth: Payer: Self-pay

## 2015-10-28 NOTE — Telephone Encounter (Signed)
-----   Message from Laura LaserAnika Cherry, MD sent at 10/27/2015 10:39 AM EDT ----- Please inform patient of normal blood count and iron levels.  Her anemia has resolved.  All other labs normal.

## 2015-10-28 NOTE — Telephone Encounter (Signed)
Called pt with interpreter services on the line [facility]([place]indy) no answer, unable to leave message as no voicemail is set up

## 2015-11-01 NOTE — Telephone Encounter (Signed)
Called pt informed her of lab results. Pt gave verbal understanding.  

## 2015-11-10 ENCOUNTER — Ambulatory Visit (INDEPENDENT_AMBULATORY_CARE_PROVIDER_SITE_OTHER): Payer: Managed Care, Other (non HMO) | Admitting: Obstetrics and Gynecology

## 2015-11-10 ENCOUNTER — Encounter: Payer: Self-pay | Admitting: Obstetrics and Gynecology

## 2015-11-10 VITALS — BP 109/67 | HR 86 | Wt 130.7 lb

## 2015-11-10 DIAGNOSIS — Z30430 Encounter for insertion of intrauterine contraceptive device: Secondary | ICD-10-CM

## 2015-11-10 LAB — POCT URINE PREGNANCY: Preg Test, Ur: NEGATIVE

## 2015-11-10 NOTE — Patient Instructions (Addendum)
Colocación de un dispositivo intrauterino - Cuidados posteriores  (Intrauterine Device Insertion, Care After)  Siga estas instrucciones durante las próximas semanas. Estas indicaciones le proporcionan información general acerca de cómo deberá cuidarse después del procedimiento. El médico también podrá darle instrucciones más específicas. El tratamiento ha sido planificado según las prácticas médicas actuales, pero en algunos casos pueden ocurrir problemas. Comuníquese con el médico si tiene algún problema o tiene dudas después del procedimiento.  QUÉ ESPERAR DESPUÉS DEL PROCEDIMIENTO  La inserción del DIU puede causar molestias, como cólicos. que deberían mejorar una vez que el DIU esté en su lugar. Podrá tener sangrado después del procedimiento. Esto es normal. Varía desde un sangrado ligero durante un par de días hasta un sangrado similar al menstrual. Cuando el DIU esté en su lugar, se extenderá un hilo de 1 a 2 pulgadas (2,5 a 5 cm) por el cuello del útero en la vagina. El hilo no debería molestarle a usted ni a su pareja. De lo contrario, consulte con su médico.   INSTRUCCIONES PARA EL CUIDADO EN EL HOGAR   · Controle su DIU para asegurarse de que esté en su lugar, antes de reanudar la actividad sexual. Tiene que sentir los hilos. Si no los siente, algo puede estar mal. El DIU puede haberse salido del útero o éste puede haber sido atravesado (perforado) durante la colocación. Además, si los hilos son más largos, puede significar que el DIU se está saliendo del útero. Si ocurre alguno de estos problemas, no estará protegida y podrá quedar embarazada.  · Puede volver a tener relaciones sexuales si no tiene problemas con el DIU. El DIU de cobre se considera efectivo y funciona de inmediato, si se inserta dentro de los 7 días del inicio del período. Será necesario que utilice un método anticonceptivo adicional durante 7 días, si el DIU se inserta en algún otro momento del ciclo.  · Controle que el DIU sigue en su  lugar sintiendo los hilos después de cada período menstrual.  · Es posible que necesite tomar analgésicos, como acetaminofeno o ibuprofeno. Tome todos los medicamentos como le indicó el médico.  SOLICITE ATENCIÓN MÉDICA SI:   · Tiene un sangrado más abundante o dura más de un ciclo menstrual normal.  · Tiene fiebre.  · Siente cólicos o dolor abdominal que no se alivian con medicamentos.  · Siente dolor abdominal que no parece estar relacionado con el área en que sentía los cólicos y el dolor anteriormente.  · Se siente mareada, inusualmente débil o se desmaya.  · Tiene flujo vaginal u olores anormales.  · Siente dolor durante las relaciones sexuales.  · No puede sentir los hilos del DIU o los siente más largos.  · Siente que el DIU está en la abertura del cuello del útero, en la vagina.  · Piensa que está embarazada o no tiene su período menstrual.  · El hilo del DIU está lastimando a su pareja sexual.  ASEGÚRESE DE QUE:  · Comprende estas instrucciones.  · Controlará su afección.  · Recibirá ayuda de inmediato si no mejora o si empeora.     Esta información no tiene como fin reemplazar el consejo del médico. Asegúrese de hacerle al médico cualquier pregunta que tenga.     Document Released: 10/17/2011 Document Revised: 11/12/2012  Elsevier Interactive Patient Education ©2016 Elsevier Inc.

## 2015-11-10 NOTE — Progress Notes (Signed)
    GYNECOLOGY CLINIC PROCEDURE NOTE  Laura ServeJulisa Mora Woods is a 30 y.o. [id number] here for Paraguard IUD insertion. No GYN concerns.  Last pap smear was on 02/2013 and was normal.  No LMP recorded. Patient is not currently having periods (Reason: Lactating).   Blood pressure 109/67, pulse 86, weight 130 lb 11.2 oz (59.3 kg), currently breastfeeding.   IUD Insertion Procedure Note Patient identified, informed consent performed, consent signed.   Discussed risks of irregular bleeding, cramping, infection, malpositioning or misplacement of the IUD outside the uterus which may require further procedure such as laparoscopy. Time out was performed.  Urine pregnancy test negative.  Speculum placed in the vagina.  Cervix visualized.  Cleaned with Betadine x 2.  Grasped anteriorly with a single tooth tenaculum.  Uterus sounded to 8 cm.  Paraguard IUD placed per manufacturer's recommendations.  Strings trimmed to 3 cm. Tenaculum was removed, good hemostasis noted.  Patient tolerated procedure well.   Patient was given post-procedure instructions.  She was advised to have backup contraception for one week.  Patient was also asked to check IUD strings periodically and follow up in 4 weeks for IUD check.   Lot: [id number] Exp: 11/2021  Hildred LaserAnika Gali Spinney, MD [facility]

## 2015-12-01 ENCOUNTER — Encounter: Payer: Managed Care, Other (non HMO) | Admitting: Obstetrics and Gynecology

## 2015-12-06 ENCOUNTER — Ambulatory Visit (INDEPENDENT_AMBULATORY_CARE_PROVIDER_SITE_OTHER): Payer: Managed Care, Other (non HMO) | Admitting: Obstetrics and Gynecology

## 2015-12-06 VITALS — BP 104/63 | HR 96 | Ht 63.0 in | Wt 135.1 lb

## 2015-12-06 DIAGNOSIS — Z30431 Encounter for routine checking of intrauterine contraceptive device: Secondary | ICD-10-CM

## 2015-12-06 DIAGNOSIS — Z975 Presence of (intrauterine) contraceptive device: Secondary | ICD-10-CM | POA: Diagnosis not present

## 2015-12-06 NOTE — Progress Notes (Signed)
     GYNECOLOGY CLINIC PROGRESS NOTE  History:  30 y.o. [id number] here today for today for IUD string check; Paraguard IUD was placed  4 weeks ago. No complaints about the IUD, no concerning side effects.  Does note that she has not yet had her cycle for this month (is 2 days late).   The following portions of the patient's history were reviewed and updated as appropriate: allergies, current medications, past family history, past medical history, past social history, past surgical history and problem list. Last pap smear on 02/2013 was normal, Negative HRHPV.  Review of Systems:  Pertinent items are noted in HPI.  Objective:  Physical Exam Blood pressure 104/63, pulse 96, height 5\' 3"  (1.6 m), weight 135 lb 1.6 oz (61.3 kg), last menstrual period 11/03/2015, currently breastfeeding. CONSTITUTIONAL: Well-developed, well-nourished female in no acute distress.  ABDOMEN: Soft, no distention noted.   PELVIC: Normal appearing external genitalia; normal appearing vaginal mucosa and cervix.  IUD strings visualized, about 3 cm in length outside cervix.  EXTREMITIES: non-tender, no edema or cyanosis NEUROLOGIC: Grossly normal  Assessment & Plan:  Normal IUD check. Patient to keep IUD in place for ten years; can come in for removal if she desires pregnancy within the next ten years. Routine preventative health maintenance measures emphasized. Advised patient to take pregnancy test if no menstrual cycle after 1 week of expected menses date.    Hildred LaserAnika Keiley Levey, MD [facility]

## 2016-12-07 ENCOUNTER — Encounter: Payer: Self-pay | Admitting: Obstetrics and Gynecology

## 2017-01-28 ENCOUNTER — Encounter: Payer: Self-pay | Admitting: Obstetrics and Gynecology

## 2017-02-28 ENCOUNTER — Encounter: Payer: Self-pay | Admitting: Obstetrics and Gynecology

## 2017-02-28 ENCOUNTER — Ambulatory Visit (INDEPENDENT_AMBULATORY_CARE_PROVIDER_SITE_OTHER): Payer: 59 | Admitting: Obstetrics and Gynecology

## 2017-02-28 VITALS — BP 99/61 | HR 84 | Ht 63.0 in | Wt 134.4 lb

## 2017-02-28 DIAGNOSIS — Z124 Encounter for screening for malignant neoplasm of cervix: Secondary | ICD-10-CM

## 2017-02-28 DIAGNOSIS — Z975 Presence of (intrauterine) contraceptive device: Secondary | ICD-10-CM | POA: Diagnosis not present

## 2017-02-28 DIAGNOSIS — K921 Melena: Secondary | ICD-10-CM

## 2017-02-28 DIAGNOSIS — Z01419 Encounter for gynecological examination (general) (routine) without abnormal findings: Secondary | ICD-10-CM

## 2017-02-28 DIAGNOSIS — N9089 Other specified noninflammatory disorders of vulva and perineum: Secondary | ICD-10-CM

## 2017-02-28 NOTE — Addendum Note (Signed)
Addended by: Brooke DareSICK, Roshelle Traub L on: 02/28/2017 04:36 PM   Modules accepted: Orders

## 2017-02-28 NOTE — Progress Notes (Signed)
GYNECOLOGY ANNUAL PHYSICAL EXAM PROGRESS NOTE  Subjective:    Laura Woods is a 32 y.o. 92P2002 female who presents for an annual exam.  The patient is sexually active. The patient wears seatbelts: yes. The patient participates in regular exercise: no. Has the patient ever been transfused or tattooed?: no. The patient reports that there is not domestic violence in her life.    The patient has the following complaints today:  1. Notes external vaginal/vulvar irritation x 1 week. Denies vaginal dishcarge.  2. Continues to note occasional passage of blood in stools.  This has been ongoing for several years. Was previously suffering from bouts of constipation however has been increasing fiber and taking stool softeners and no longer deals with the issue, but is still noting some blood.    Gynecologic History No LMP recorded. Patient is not currently having periods (Reason: Lactating).  Contraception: IUD (ParaGard, inserted 11/2015) History of STI's: Denies Last Pap: 02/2013. Results were: normal.  Denies h/o abnormal pap smears.    Obstetric History   G2   P2   T2   P0   A0   L2    SAB0   TAB0   Ectopic0   Multiple0   Live Births2     # Outcome Date GA Lbr Len/2nd Weight Sex Delivery Anes PTL Lv  2 Term 03/16/15 [id number] 06:08 / 03:07 7 lb 3.3 oz (3.27 kg) M Vag-Spont EPI  LIV     Name: MORA Woods,PENDINGBABY     Apgar1:  8                Apgar5: 9  1 Term 09/29/09 [id number]  6 lb 16 oz (3.175 kg) M CS-LTranv   LIV     Complications: Failure to Progress in Second Stage    Obstetric Comments  Asynclitism of fetal head    Past Medical History:  Diagnosis Date  . Anemia   . Constipation     Past Surgical History:  Procedure Laterality Date  . CESAREAN SECTION    . COLON SURGERY      Family History  Problem Relation Age of Onset  . Healthy Mother   . Healthy Father     Social History   Socioeconomic History  . Marital status: Single    Spouse name: Not on file   . Number of children: Not on file  . Years of education: Not on file  . Highest education level: Not on file  Social Needs  . Financial resource strain: Not on file  . Food insecurity - worry: Not on file  . Food insecurity - inability: Not on file  . Transportation needs - medical: Not on file  . Transportation needs - non-medical: Not on file  Occupational History  . Not on file  Tobacco Use  . Smoking status: Never Smoker  . Smokeless tobacco: Never Used  Substance and Sexual Activity  . Alcohol use: No  . Drug use: No  . Sexual activity: Yes    Birth control/protection: Condom  Other Topics Concern  . Not on file  Social History Narrative  . Not on file    Current Outpatient Medications on File Prior to Visit  Medication Sig Dispense Refill  . hydrocortisone (ANUSOL-HC) 25 MG suppository Place 1 suppository (25 mg total) rectally 2 (two) times daily. 12 suppository 0  . hydrocortisone cream 0.5 % Apply 1 application topically 2 (two) times daily. Until rash gone 30 g 0  . PARAGARD  INTRAUTERINE COPPER IUD IUD 1 each by Intrauterine route once. 1 each 0  . vitamin B-12 (CYANOCOBALAMIN) 100 MCG tablet Take 100 mcg by mouth daily.     No current facility-administered medications on file prior to visit.     No Known Allergies    Review of Systems Constitutional: negative for chills, fatigue, fevers and sweats Eyes: negative for irritation, redness and visual disturbance Ears, nose, mouth, throat, and face: negative for hearing loss, nasal congestion, snoring and tinnitus Respiratory: negative for asthma, cough, sputum Cardiovascular: negative for chest pain, dyspnea, exertional chest pressure/discomfort, irregular heart beat, palpitations and syncope Gastrointestinal: positive for bloody stools (bright red blood).  Negative for abdominal pain, change in bowel habits, nausea and vomiting Genitourinary: Positive for vulvar irritation. Negative for abnormal menstrual  periods, genital lesions, sexual problems and vaginal discharge, dysuria and urinary incontinence Integument/breast:  Negative for breast lump, breast tenderness and nipple discharge Hematologic/lymphatic: negative for bleeding and easy bruising Musculoskeletal:negative for back pain and muscle weakness Neurological: negative for dizziness, headaches, vertigo and weakness Endocrine: negative for diabetic symptoms including polydipsia, polyuria and skin dryness Allergic/Immunologic: negative for hay fever and urticaria      Objective:  Blood pressure 99/61, pulse 84, height 5\' 3"  (1.6 m), weight 134 lb 7 oz (61 kg), last menstrual period 01/25/2017, not currently breastfeeding. Body mass index is 23.81 kg/m.  General Appearance:    Alert, cooperative, no distress, appears stated age  Head:    Normocephalic, without obvious abnormality, atraumatic  Eyes:    PERRL, conjunctiva/corneas clear, EOM's intact, both eyes  Ears:    Normal external ear canals, both ears  Nose:   Nares normal, septum midline, mucosa normal, no drainage or sinus tenderness  Throat:   Lips, mucosa, and tongue normal; teeth and gums normal  Neck:   Supple, symmetrical, trachea midline, no adenopathy; thyroid: no enlargement/tenderness/nodules; no carotid bruit or JVD  Back:     Symmetric, no curvature, ROM normal, no CVA tenderness  Lungs:     Clear to auscultation bilaterally, respirations unlabored  Chest Wall:    No tenderness or deformity   Heart:    Regular rate and rhythm, S1 and S2 normal, no murmur, rub or gallop  Breast Exam:    No tenderness, masses, or nipple abnormality  Abdomen:     Soft, non-tender, bowel sounds active all four quadrants, no masses, no organomegaly.    Genitalia:    Pelvic:external genitalia normal, vagina without lesions, discharge, or tenderness, rectovaginal septum  normal. Cervix normal in appearance, no cervical motion tenderness, no adnexal masses or tenderness.  Uterus normal size,  shape, mobile, regular contours, nontender.  Rectal:    Normal external sphincter.  No external hemorrhoids appreciated. Internal exam with no palpable internal hemorrhoid palpable.    Extremities:   Extremities normal, atraumatic, no cyanosis or edema  Pulses:   2+ and symmetric all extremities  Skin:   Skin color, texture, turgor normal. No lesions or rashes.     Lymph nodes:   Cervical, supraclavicular, and axillary nodes normal  Neurologic:   CNII-XII intact, normal strength, sensation and reflexes throughout   .  Labs:  Lab Results  Component Value Date   WBC 6.5 10/26/2015   HGB 12.4 10/26/2015   HCT 36.0 10/26/2015   MCV 84 10/26/2015   PLT 252 10/26/2015    Lab Results  Component Value Date   CREATININE 0.54 (L) 10/26/2015   BUN 11 10/26/2015   NA 142 10/26/2015  K 3.9 10/26/2015   CL 99 10/26/2015   CO2 26 10/26/2015    Lab Results  Component Value Date   ALT 14 10/26/2015   AST 15 10/26/2015   ALKPHOS 73 10/26/2015   BILITOT 0.4 10/26/2015    No results found for: TSH   Assessment:   Routine gynecologic exam Rectal bleeding Vulvar irritation Cervical cancer screening IUD in place     Plan:     Blood tests: CBC with diff and Comprehensive metabolic panel. Breast self exam technique reviewed and patient encouraged to perform self-exam monthly. Contraception: Paraguard IUD Discussed healthy lifestyle modifications. Pap smear performed today.   Will send to GI for further evaluation of rectal bleeding.  Advised on care with shaving/hair removal techniques in vagina/vulvar region.  No rashes or lesions present. Discussed use of aloe or T-tree oil in irritated region. If no relief, can consider hydrocortisone cream OTC for several days.  Follow up in 1 year for annual exam, or sooner as needed.    Hildred Laser, MD [facility]

## 2017-02-28 NOTE — Patient Instructions (Signed)

## 2017-02-28 NOTE — Progress Notes (Signed)
Pt is here for annual pap smear. Paragard in place. No history of abnormals.Would like lab work done also.

## 2017-03-01 LAB — CBC
HEMATOCRIT: 35.8 % (ref 34.0–46.6)
Hemoglobin: 11.8 g/dL (ref 11.1–15.9)
MCH: 28.4 pg (ref 26.6–33.0)
MCHC: 33 g/dL (ref 31.5–35.7)
MCV: 86 fL (ref 79–97)
Platelets: 230 10*3/uL (ref 150–379)
RBC: 4.16 x10E6/uL (ref 3.77–5.28)
RDW: 13.1 % (ref 12.3–15.4)
WBC: 5.7 10*3/uL (ref 3.4–10.8)

## 2017-03-01 LAB — COMPREHENSIVE METABOLIC PANEL
A/G RATIO: 2 (ref 1.2–2.2)
ALT: 20 IU/L (ref 0–32)
AST: 19 IU/L (ref 0–40)
Albumin: 4.4 g/dL (ref 3.5–5.5)
Alkaline Phosphatase: 55 IU/L (ref 39–117)
BUN/Creatinine Ratio: 16 (ref 9–23)
BUN: 10 mg/dL (ref 6–20)
Bilirubin Total: 0.4 mg/dL (ref 0.0–1.2)
CHLORIDE: 104 mmol/L (ref 96–106)
CO2: 22 mmol/L (ref 20–29)
CREATININE: 0.61 mg/dL (ref 0.57–1.00)
Calcium: 8.8 mg/dL (ref 8.7–10.2)
GFR calc Af Amer: 140 mL/min/{1.73_m2} (ref >59)
GFR calc non Af Amer: 121 mL/min/{1.73_m2} (ref >59)
GLOBULIN, TOTAL: 2.2 g/dL (ref 1.5–4.5)
Glucose: 116 mg/dL — ABNORMAL HIGH (ref 65–99)
POTASSIUM: 3.7 mmol/L (ref 3.5–5.2)
SODIUM: 141 mmol/L (ref 134–144)
Total Protein: 6.6 g/dL (ref 6.0–8.5)

## 2017-03-02 LAB — PAP IG AND HPV HIGH-RISK
HPV, high-risk: NEGATIVE
PAP Smear Comment: 0

## 2017-03-06 ENCOUNTER — Telehealth: Payer: Self-pay | Admitting: *Deleted

## 2017-03-06 NOTE — Telephone Encounter (Signed)
Mailed all info to pt 

## 2017-03-06 NOTE — Telephone Encounter (Signed)
-----   Message from Hildred LaserAnika Cherry, MD sent at 03/05/2017  4:18 PM EST ----- Her labs are normal except her glucose level was a little elevated (but may be explained if she was not fasting).

## 2017-03-08 ENCOUNTER — Telehealth: Payer: Self-pay | Admitting: *Deleted

## 2017-03-08 NOTE — Telephone Encounter (Signed)
Patient called and is inquiring about her lab results. Patient is requesting a call back. Her contact # is [phone]. Please advise. Thank you

## 2017-03-08 NOTE — Telephone Encounter (Signed)
[facility] full- unable to leave message.  [facility]

## 2017-03-11 NOTE — Telephone Encounter (Signed)
Mailbox full -unable to leave message  Chart filed.

## 2017-03-13 ENCOUNTER — Telehealth: Payer: Self-pay | Admitting: Obstetrics and Gynecology

## 2017-03-13 NOTE — Telephone Encounter (Signed)
The patient called and stated that she would like to speak with a nurse in regards to her results she recently received. No other information was disclosed. Please advise.

## 2017-03-15 NOTE — Telephone Encounter (Signed)
Pt had questions concerning her labs that she had done 2 weeks ago Pt was concerned about her glycose levels. Pt stated that the day of her labs she didn't eat and became lightheaded and dizzy during her visit and AC gave her some apple juice which could have caused the evaluated glycose levels pt stated that she was still not feeling well and was encouraged to seek medical care at her local urgent care

## 2017-09-18 ENCOUNTER — Encounter: Payer: Self-pay | Admitting: Obstetrics and Gynecology

## 2017-09-18 ENCOUNTER — Ambulatory Visit: Payer: 59 | Admitting: Obstetrics and Gynecology

## 2017-09-18 VITALS — BP 112/68 | HR 90 | Ht 62.5 in | Wt 132.8 lb

## 2017-09-18 DIAGNOSIS — R5383 Other fatigue: Secondary | ICD-10-CM | POA: Diagnosis not present

## 2017-09-18 DIAGNOSIS — Z3202 Encounter for pregnancy test, result negative: Secondary | ICD-10-CM

## 2017-09-18 DIAGNOSIS — R519 Headache, unspecified: Secondary | ICD-10-CM

## 2017-09-18 DIAGNOSIS — R42 Dizziness and giddiness: Secondary | ICD-10-CM | POA: Diagnosis not present

## 2017-09-18 DIAGNOSIS — Z30431 Encounter for routine checking of intrauterine contraceptive device: Secondary | ICD-10-CM

## 2017-09-18 DIAGNOSIS — R51 Headache: Secondary | ICD-10-CM | POA: Diagnosis not present

## 2017-09-18 LAB — POCT URINE PREGNANCY: Preg Test, Ur: NEGATIVE

## 2017-09-18 NOTE — Progress Notes (Signed)
    GYNECOLOGY PROGRESS NOTE  Subjective:    Patient ID: Laura Woods, female    DOB: 1985/06/05, 32 y.o.   MRN: [id number]  HPI  Patient is a 32 y.o. [id number] female who presents for complaints of feeling tired and lightheaded. This has been ongoing for several weeks.  She desires to have her blood levels checked for anemia. She does not have a prior history except during pregnancy.  She also desires to have her ParaGard IUD checked and a UPT as her symptoms resemble the last time she was pregnant.   In addition, patient notes intermittent headaches that have been ongoing for several months. Denies h/o migraines, but does note some instances with sound sensitivity and ringing in the ears. She denies nausea/vomiting, light sensitivity.  She takes OTC headache medications with occasional relief. Denies sinus or allergy symptoms.   The following portions of the patient's history were reviewed and updated as appropriate: allergies, current medications, past family history, past medical history, past social history, past surgical history and problem list.  Review of Systems Pertinent items noted in HPI and remainder of comprehensive ROS otherwise negative.   Objective:   Blood pressure 112/68, pulse 90, height 5' 2.5" (1.588 m), weight 132 lb 12.8 oz (60.2 kg), last menstrual period 08/22/2017. General appearance: alert and no distress  Heart: normal rate, regular rhythm, normal S1, S2, no murmurs, rubs, clicks or gallops. Lungs: Normal respiratory effort, chest expands symmetrically. Lungs are clear to auscultation, no crackles or wheezes. Pelvis: external genitalia normal, rectovaginal septum normal.  Vagina with scant mucoid discharge.  Cervix normal appearing, no lesions, IUD strings 3 cm in length. Bimanual exam not performed.  Skin: no sores or suspicious lesions or rashes or color changes. Skin turgor normal, no pallor.  Neurologic: grossly intact   Assessment:    Lightheadedness Fatigue Encounter for pregnancy test IUD check Headaches  Plan:   - Will order H/H, and check iron levels, TSH, and Vitamin D for lightheadedness and fatigue. Continue to encourage adequate hydration, adequate rest.  - UPT negative today. Given reassurance.  - IUD in place, no concerns. Given reassurance.  - Headaches, possibly migraines. OTC meds (Tylenol and Ibuprofen) offer occasional relief. Discussed use of Excedrin Migraine or Marlin CanaryGoody' BC powder for symptoms. If no relief, can further evaluate and treat with prescription medications as needed.    Hildred Laserherry, Carrell Rahmani, MD [facility]

## 2017-09-18 NOTE — Patient Instructions (Signed)

## 2017-09-18 NOTE — Progress Notes (Signed)
Pt stated that she has been feeling tired, lightheaded, having headaches, pt stated that she wants her ParaGard placement checked and she want her anemia check. Pt stated that she has been feeling like this for 1 week. Pt required UPT to done; test was negative.

## 2017-09-19 ENCOUNTER — Other Ambulatory Visit: Payer: 59

## 2017-09-19 ENCOUNTER — Encounter: Payer: Self-pay | Admitting: Obstetrics and Gynecology

## 2017-09-20 LAB — HEMOGLOBIN AND HEMATOCRIT, BLOOD
HEMATOCRIT: 37.4 % (ref 34.0–46.6)
Hemoglobin: 12.6 g/dL (ref 11.1–15.9)

## 2017-09-20 LAB — IRON,TIBC AND FERRITIN PANEL
FERRITIN: 14 ng/mL — AB (ref 15–150)
IRON SATURATION: 35 % (ref 15–55)
IRON: 140 ug/dL (ref 27–159)
TIBC: 397 ug/dL (ref 250–450)
UIBC: 257 ug/dL (ref 131–425)

## 2017-09-20 LAB — VITAMIN D 25 HYDROXY (VIT D DEFICIENCY, FRACTURES): Vit D, 25-Hydroxy: 23.5 ng/mL — ABNORMAL LOW (ref 30.0–100.0)

## 2017-09-20 LAB — TSH: TSH: 0.922 u[IU]/mL (ref 0.450–4.500)

## 2017-09-25 ENCOUNTER — Other Ambulatory Visit: Payer: Self-pay

## 2017-09-25 MED ORDER — VITAMIN D (ERGOCALCIFEROL) 1.25 MG (50000 UNIT) PO CAPS: 50000.0000 [IU] | ORAL_CAPSULE | ORAL | 0 refills | Status: AC

## 2017-09-25 NOTE — Telephone Encounter (Signed)
Pt was called and informed that her Vit D was sent to [facility] in [place] and she should be receiving a letter with information on her medication and a list of foods that are high in iron.

## 2017-11-14 ENCOUNTER — Other Ambulatory Visit: Payer: Self-pay
